# Patient Record
Sex: Male | Born: 1955 | Race: White | Hispanic: No | Marital: Married | Smoking: Former smoker
Health system: Southern US, Community
[De-identification: ages and names within clinical notes are randomized; demographics above are authoritative.]

## PROBLEM LIST (undated history)

## (undated) DIAGNOSIS — E785 Hyperlipidemia, unspecified: Secondary | ICD-10-CM

## (undated) DIAGNOSIS — Z973 Presence of spectacles and contact lenses: Secondary | ICD-10-CM

## (undated) DIAGNOSIS — H9319 Tinnitus, unspecified ear: Secondary | ICD-10-CM

## (undated) DIAGNOSIS — Z8719 Personal history of other diseases of the digestive system: Secondary | ICD-10-CM

## (undated) DIAGNOSIS — Z87440 Personal history of urinary (tract) infections: Secondary | ICD-10-CM

## (undated) DIAGNOSIS — T7840XA Allergy, unspecified, initial encounter: Secondary | ICD-10-CM

## (undated) DIAGNOSIS — K219 Gastro-esophageal reflux disease without esophagitis: Secondary | ICD-10-CM

## (undated) DIAGNOSIS — M1990 Unspecified osteoarthritis, unspecified site: Secondary | ICD-10-CM

## (undated) HISTORY — DX: Allergy, unspecified, initial encounter: T78.40XA

## (undated) HISTORY — PX: OTHER SURGICAL HISTORY: SHX169

## (undated) HISTORY — PX: WRIST SURGERY: SHX841

## (undated) HISTORY — DX: Hyperlipidemia, unspecified: E78.5

---

## 2010-12-24 ENCOUNTER — Encounter (INDEPENDENT_AMBULATORY_CARE_PROVIDER_SITE_OTHER): Payer: Self-pay | Admitting: *Deleted

## 2011-01-02 NOTE — Letter (Signed)
Summary: Pre Visit Letter Revised  [facility]  [address] [place], [place] [address]   Phone: [phone]  Fax: [phone]        12/24/2010 MRN: [id number] Jacob Griffith [address] [place], [place]  [address]             Procedure Date: February 03, 2011   dir col-Dr Tomasita Morrow to the Gastroenterology Division at [facility].    You are scheduled to see a nurse for your pre-procedure visit on January 20, 2011 at 4:30pm on the 3rd floor at [facility], [address]  We ask that you try to arrive at our office 15 minutes prior to your appointment time to allow for check-in.  Please take a minute to review the attached form.  If you answer "Yes" to one or more of the questions on the first page, we ask that you call the person listed at your earliest opportunity.  If you answer "No" to all of the questions, please complete the rest of the form and bring it to your appointment.    Your nurse visit will consist of discussing your medical and surgical history, your immediate family medical history, and your medications.   If you are unable to list all of your medications on the form, please bring the medication bottles to your appointment and we will list them.  We will need to be aware of both prescribed and over the counter drugs.  We will need to know exact dosage information as well.    Please be prepared to read and sign documents such as consent forms, a financial agreement, and acknowledgement forms.  If necessary, and with your consent, a friend or relative is welcome to sit-in on the nurse visit with you.  Please bring your insurance card so that we may make a copy of it.  If your insurance requires a referral to see a specialist, please bring your referral form from your primary care physician.  No co-pay is required for this nurse visit.     If you cannot keep your appointment, please call [phone] to cancel or reschedule prior to your  appointment date.  This allows [place] the opportunity to schedule an appointment for another patient in need of care.    Thank you for choosing [facility] for your medical needs.  We appreciate the opportunity to care for you.  Please visit [place] at our website  to learn more about our practice.  Sincerely, The Gastroenterology Division

## 2011-01-14 ENCOUNTER — Encounter (INDEPENDENT_AMBULATORY_CARE_PROVIDER_SITE_OTHER): Payer: Self-pay | Admitting: *Deleted

## 2011-01-20 ENCOUNTER — Encounter: Payer: Self-pay | Admitting: Gastroenterology

## 2011-01-28 NOTE — Letter (Signed)
Summary: [facility] Instructions  [facility]  [address] [place], [place] [address]   Phone: [phone]  Fax: [phone]       ANAND TEJADA    09-Oct-1970    MRN: [id number]        Procedure Day Dorna Bloom:  Duanne Limerick 02/03/11     Arrival Time: 9:30am     Procedure Time: 10:30am     Location of Procedure:                    Juliann Pares  [facility] (4th Floor)                        PREPARATION FOR COLONOSCOPY WITH MOVIPREP   Starting 5 days prior to your procedure  [facility] 03/14 do not eat nuts, seeds, popcorn, corn, beans, peas,  salads, or any raw vegetables.  Do not take any fiber supplements (e.g. Metamucil, Citrucel, and Benefiber).  THE DAY BEFORE YOUR PROCEDURE         DATE: SUNDAY 03/18  1.  Drink clear liquids the entire day-NO SOLID FOOD  2.  Do not drink anything colored red or purple.  Avoid juices with pulp.  No orange juice.  3.  Drink at least 64 oz. (8 glasses) of fluid/clear liquids during the day to prevent dehydration and help the prep work efficiently.  CLEAR LIQUIDS INCLUDE: Water Jello Ice Popsicles Tea (sugar ok, no milk/cream) Powdered fruit flavored drinks Coffee (sugar ok, no milk/cream) Gatorade Juice: apple, white grape, white cranberry  Lemonade Clear bullion, consomm, broth Carbonated beverages (any kind) Strained chicken noodle soup Hard Candy                             4.  In the morning, mix first dose of MoviPrep solution:    Empty 1 Pouch A and 1 Pouch B into the disposable container    Add lukewarm drinking water to the top line of the container. Mix to dissolve    Refrigerate (mixed solution should be used within 24 hrs)  5.  Begin drinking the prep at 5:00 p.m. The MoviPrep container is divided by 4 marks.   Every 15 minutes drink the solution down to the next mark (approximately 8 oz) until the full liter is complete.   6.  Follow completed prep with 16 oz of clear liquid of your choice (Nothing  red or purple).  Continue to drink clear liquids until bedtime.  7.  Before going to bed, mix second dose of MoviPrep solution:    Empty 1 Pouch A and 1 Pouch B into the disposable container    Add lukewarm drinking water to the top line of the container. Mix to dissolve    Refrigerate  THE DAY OF YOUR PROCEDURE      DATE: MONDAY 03/19  Beginning at  5:30a.m. (5 hours before procedure):         1. Every 15 minutes, drink the solution down to the next mark (approx 8 oz) until the full liter is complete.  2. Follow completed prep with 16 oz. of clear liquid of your choice.    3. You may drink clear liquids until 8:30am  (2 HOURS BEFORE PROCEDURE).   MEDICATION INSTRUCTIONS  Unless otherwise instructed, you should take regular prescription medications with a small sip of water   as early as possible the morning of your procedure.  OTHER INSTRUCTIONS  You will need a responsible adult at least 53 years of age to accompany you and drive you home.   This person must remain in the waiting room during your procedure.  Wear loose fitting clothing that is easily removed.  Leave jewelry and other valuables at home.  However, you may wish to bring a book to read or  an iPod/MP3 player to listen to music as you wait for your procedure to start.  Remove all body piercing jewelry and leave at home.  Total time from sign-in until discharge is approximately 2-3 hours.  You should go home directly after your procedure and rest.  You can resume normal activities the  day after your procedure.  The day of your procedure you should not:   Drive   Make legal decisions   Operate machinery   Drink alcohol   Return to work  You will receive specific instructions about eating, activities and medications before you leave.    The above instructions have been reviewed and explained to me by   Ezra Sites RN  January 20, 2011 4:57 PM     I fully understand and can  verbalize these instructions _____________________________ Date _________

## 2011-01-28 NOTE — Miscellaneous (Signed)
Summary: [facility]  Clinical Lists Changes  Medications: Added new medication of MOVIPREP 100 GM  SOLR (PEG-KCL-NACL-NASULF-NA ASC-C) As per prep instructions. - Signed Rx of MOVIPREP 100 GM  SOLR (PEG-KCL-NACL-NASULF-NA ASC-C) As per prep instructions.;  #1 x 0;  Signed;  Entered by: Ezra Sites RN;  Authorized by: Mardella Layman MD [facility];  Method used: [facility]  [phone] [place], [place], [place]  [address], Ph: [phone] or [phone], Fax: [phone] Allergies: Added new allergy or adverse reaction of AMOXICILLIN Observations: Added new observation of NKA: F (01/20/2011 16:25)    Prescriptions: MOVIPREP 100 GM  SOLR (PEG-KCL-NACL-NASULF-NA ASC-C) As per prep instructions.  #1 x 0   Entered by:   Ezra Sites RN   Authorized by:   Mardella Layman MD [facility]   Signed by:   Ezra Sites RN on 01/20/2011   Method used:   [facility]ronically to        [facility]  [address] [phone]* (retail)       [address] [place], [place]  [address]       Ph: [phone] or [phone]       Fax: [phone]   RxID:   [id number]

## 2011-02-03 ENCOUNTER — Other Ambulatory Visit: Payer: Self-pay | Admitting: Gastroenterology

## 2011-02-03 ENCOUNTER — Other Ambulatory Visit (AMBULATORY_SURGERY_CENTER): Payer: BC Managed Care – PPO | Admitting: Gastroenterology

## 2011-02-03 DIAGNOSIS — K5730 Diverticulosis of large intestine without perforation or abscess without bleeding: Secondary | ICD-10-CM

## 2011-02-03 DIAGNOSIS — Z1211 Encounter for screening for malignant neoplasm of colon: Secondary | ICD-10-CM

## 2011-02-03 DIAGNOSIS — D126 Benign neoplasm of colon, unspecified: Secondary | ICD-10-CM

## 2011-02-07 ENCOUNTER — Encounter: Payer: Self-pay | Admitting: Gastroenterology

## 2011-02-13 NOTE — Procedures (Addendum)
Summary: Colonoscopy  Patient: [name] Note: All result statuses are Final unless otherwise noted.  Tests: (1) Colonoscopy (COL)   COL Colonoscopy           DONE     [facility]     [address]     [place], [place]  [address]          COLONOSCOPY PROCEDURE REPORT          PATIENT:  Jacob Griffith, Jacob Griffith  MR#:  [id number]     BIRTHDATE:  Apr 07, 1956, 54 yrs. old  GENDER:  male     ENDOSCOPIST:  Vania Rea. Jarold Motto, MD, [facility]     REF. BY:     PROCEDURE DATE:  02/03/2011     PROCEDURE:  Colonoscopy with snare polypectomy     ASA CLASS:  Class I     INDICATIONS:  Routine Risk Screening     MEDICATIONS:   Fentanyl 100 mcg IV, Versed 7 mg IV          DESCRIPTION OF PROCEDURE:   After the risks benefits and     alternatives of the procedure were thoroughly explained, informed     consent was obtained.  Digital rectal exam was performed and     revealed no abnormalities.   The LB 180AL [id number] endoscope was     introduced through the anus and advanced to the cecum, which was     identified by both the appendix and ileocecal valve, without     limitations.  The quality of the prep was excellent, using     MoviPrep.  The instrument was then slowly withdrawn as the colon     was fully examined.     <<PROCEDUREIMAGES>>          FINDINGS:  Mild diverticulosis was found in the sigmoid to     descending colon segments.  A sessile polyp was found. 5 MM     SESSILE SIGMOID POLYP HOT SNARE EXCISED.  This was otherwise a     normal examination of the colon.   Retroflexed views in the rectum     revealed no abnormalities.    The scope was then withdrawn from     the patient and the procedure completed.          COMPLICATIONS:  None     ENDOSCOPIC IMPRESSION:     1) Mild diverticulosis in the sigmoid to descending colon     segments     2) Sessile polyp     3) Otherwise normal examination     R/O ADENOMA.     RECOMMENDATIONS:     1) high fiber diet     2) Repeat colonoscopy  in 5 years if polyp adenomatous; otherwise     10 years     REPEAT EXAM:  No          ______________________________     Vania Rea. Jarold Motto, MD, [facility]          CC:  Kirby Funk, MD          n.     Rosalie Doctor:   Vania Rea. Malakie Balis at 02/03/2011 10:57 AM          Maricela Bo, [phone]  Note: An exclamation mark (!) indicates a result that was not dispersed into the flowsheet. Document Creation Date: 02/03/2011 10:57 AM _______________________________________________________________________  (1) Order result status: Final Collection or observation date-time: 02/03/2011 10:49 Requested date-time:  Receipt date-time:  Reported date-time:  Referring Physician:   Ordering Physician: Sheryn Bison [phone]) Specimen Source:  Source: Launa Grill Order Number: [id number] Lab site:   Appended Document: Colonoscopy     Procedures Next Due Date:    Colonoscopy: 02/2021

## 2011-02-18 NOTE — Letter (Signed)
Summary: Patient Notice- Polyp Results  [facility]  [address] [place], [place] [address]   Phone: [phone]  Fax: [phone]        February 07, 2011 MRN: [id number]    Jacob Griffith [address] [place], [place]  [address]    Dear [name],  I am pleased to inform you that the colon polyp(s) removed during your recent colonoscopy was (were) found to be benign (no cancer detected) upon pathologic examination.  I recommend you have a repeat colonoscopy examination in 10_ years to look for recurrent polyps, as having colon polyps increases your risk for having recurrent polyps or even colon cancer in the future.  Should you develop new or worsening symptoms of abdominal pain, bowel habit changes or bleeding from the rectum or bowels, please schedule an evaluation with either your primary care physician or with me.  Additional information/recommendations:  _xx_ No further action with gastroenterology is needed at this time. Please      follow-up with your primary care physician for your other healthcare      needs.  __ Please call [phone] to schedule a return visit to review your      situation.  __ Please keep your follow-up visit as already scheduled.  __ Continue treatment plan as outlined the day of your exam.  Please call us if you are having persistent problems or have questions about your condition that have not been fully answered at this time.  Sincerely,  Mardella Layman MD [facility]  This letter has been electronically signed by your physician.

## 2012-02-10 ENCOUNTER — Ambulatory Visit (INDEPENDENT_AMBULATORY_CARE_PROVIDER_SITE_OTHER): Payer: BC Managed Care – PPO | Admitting: Family Medicine

## 2012-02-10 VITALS — BP 116/78 | HR 70 | Temp 98.4°F | Resp 16 | Ht 70.0 in | Wt 174.0 lb

## 2012-02-10 DIAGNOSIS — Z79899 Other long term (current) drug therapy: Secondary | ICD-10-CM

## 2012-02-10 DIAGNOSIS — M47812 Spondylosis without myelopathy or radiculopathy, cervical region: Secondary | ICD-10-CM

## 2012-02-10 DIAGNOSIS — M25519 Pain in unspecified shoulder: Secondary | ICD-10-CM

## 2012-02-10 MED ORDER — PREDNISONE 20 MG PO TABS: ORAL_TABLET | ORAL | Status: DC

## 2012-02-10 MED ORDER — CYCLOBENZAPRINE HCL 5 MG PO TABS: 5.0000 mg | ORAL_TABLET | Freq: Three times a day (TID) | ORAL | Status: AC | PRN

## 2012-02-10 NOTE — Progress Notes (Signed)
  Subjective:    Patient ID: Jacob Griffith, male    DOB: [birth year], 56 y.o.   MRN: [id number]  HPI Jacob Griffith is a 56 y.o. male L posterior shoulder pain x 2weeks, woke up with pain in neck.  Then as extended arm to throw something, felt pain into L shoulder.  Found sore area in back of L shoulder last night. Feels numbness in 2-3rd fingers just like last time - for past week.  No weakness in arm, and helps to elevate arm.  Woke up with crick in the neck 2 years ago. - seen here for same pain in shoulder - resolved with treatment last visit.  Had been doing ok since last ov - no residual pain.  No rash.  TX: ice, no pain meds.     Review of Systems Per hpi - no weakness, no sob/chest sx's.    Objective:   Physical Exam  Constitutional: He is oriented to person, place, and time. He appears well-developed and well-nourished.  Neck: Decreased range of motion present.  Cardiovascular: Normal rate, regular rhythm, normal heart sounds and intact distal pulses.   No murmur heard. Pulmonary/Chest: Effort normal.  Musculoskeletal:       Cervical back: He exhibits decreased range of motion. He exhibits no bony tenderness.       l shoulder to arm pain with flex, l rotation, and l lateral flexion.  Sx's relieved with shoulder abduction.  Neurological: He is alert and oriented to person, place, and time. He has normal strength. A sensory deficit is present.  Reflex Scores:      Tricep reflexes are 1+ on the right side and 1+ on the left side.      Bicep reflexes are 1+ on the right side and 1+ on the left side.      Brachioradialis reflexes are 2+ on the right side and 2+ on the left side.      2nd-3rd L fingers slight decr sensation.  Full arm/grip strength bilaterally.          Assessment & Plan:  Jacob Griffith is a 56 y.o. male L nec - to shoulder pain, and dysesthesias in C7 distribution.  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 Prednisone 20mg  - 3,3,2,2,1,1,1/2,1/2.  Flexeril 5mg  QHS -Q8hprn.  SED of both meds. Discussed XR if not improving with above in next 7-10 days,  or sooner if worsening including any weakness.

## 2013-08-25 ENCOUNTER — Ambulatory Visit (HOSPITAL_COMMUNITY)
Admission: RE | Admit: 2013-08-25 | Discharge: 2013-08-25 | Disposition: A | Discharge status: Home / Self Care | Payer: BC Managed Care – PPO | Source: Ambulatory Visit | Attending: Physical Medicine and Rehabilitation | Admitting: Physical Medicine and Rehabilitation

## 2013-08-25 ENCOUNTER — Other Ambulatory Visit (HOSPITAL_COMMUNITY): Payer: Self-pay | Admitting: Physical Medicine and Rehabilitation

## 2013-08-25 DIAGNOSIS — M545 Low back pain: Secondary | ICD-10-CM

## 2013-08-25 DIAGNOSIS — Z1389 Encounter for screening for other disorder: Secondary | ICD-10-CM | POA: Insufficient documentation

## 2013-08-26 ENCOUNTER — Ambulatory Visit: Payer: BC Managed Care – PPO

## 2013-08-26 ENCOUNTER — Telehealth: Payer: Self-pay | Admitting: Radiology

## 2013-08-26 ENCOUNTER — Ambulatory Visit (INDEPENDENT_AMBULATORY_CARE_PROVIDER_SITE_OTHER): Payer: BC Managed Care – PPO | Admitting: Internal Medicine

## 2013-08-26 VITALS — BP 140/80 | HR 71 | Temp 98.0°F | Resp 16 | Ht 68.5 in | Wt 184.0 lb

## 2013-08-26 DIAGNOSIS — M25531 Pain in right wrist: Secondary | ICD-10-CM

## 2013-08-26 DIAGNOSIS — M25539 Pain in unspecified wrist: Secondary | ICD-10-CM

## 2013-08-26 NOTE — Telephone Encounter (Signed)
Patient advised.

## 2013-08-26 NOTE — Telephone Encounter (Signed)
Left message for patient, his xray shows no fracture.

## 2013-08-26 NOTE — Progress Notes (Signed)
  Subjective:    Patient ID: Jacob Griffith, male    DOB: 30-May-1956, 57 y.o.   MRN: [id number]  HPI 57 year old male presents with right wrist pain s/p an injury while chopping wood 6 days ago.  Had not direct blow to his wrist but recalls pain while chopping. Since then he has had pain with ROM and to palpation.  No weakness or paresthesias.  Denies any prior wrist injuries or fractures.  He is left handed Patient is otherwise doing well with no other concerns today.     Review of Systems  Musculoskeletal: Positive for arthralgias (right wrist) and joint swelling.  Skin: Negative for wound.  Neurological: Negative for weakness and numbness.       Objective:   Physical Exam  Constitutional: He is oriented to person, place, and time. He appears well-developed.  HENT:  Head: Normocephalic and atraumatic.  Right Ear: External ear normal.  Left Ear: External ear normal.  Eyes: Conjunctivae are normal.  Neck: Normal range of motion.  Cardiovascular: Normal rate.   Pulmonary/Chest: Effort normal.  Musculoskeletal:       Right wrist: He exhibits decreased range of motion (secondary to pain), tenderness, bony tenderness (styloid process of the ulna) and swelling. He exhibits no deformity and no laceration.  Capillary refill normal <2 seconds. Sensation intact  Neurological: He is alert and oriented to person, place, and time.  Psychiatric: He has a normal mood and affect. His behavior is normal. Judgment and thought content normal.         [facility] reading (PRIMARY) by  Dr. Merla Riches as questionable nondisplaced fracture of ulnar styloid. .   Assessment & Plan:  Wrist pain, acute, right - Plan: DG Wrist Complete Right  Placed in wrist splint for comfort Continue to ice for 20 minutes to help with swelling Ibuprofen or tylenol as needed for pain Patient has an orthopedist at [facility] and will follow up with them if pain continues in 1-2 weeks RTC if acutely worsening.    I  have reviewed and agree with documentation. Robert P. Merla Riches, M.D.

## 2016-04-11 ENCOUNTER — Ambulatory Visit: Payer: Self-pay | Admitting: Orthopedic Surgery

## 2016-04-11 NOTE — Progress Notes (Signed)
Preoperative surgical orders have been place into the Epic hospital system for [name] on 04/11/2016, 3:54 PM  by Mickel Crow for surgery on 04-23-2016.  Preop Total Hip - Anterior Approach orders including IV Tylenol, and IV Decadron as long as there are no contraindications to the above medications. Arlee Muslim, PA-C

## 2016-04-17 ENCOUNTER — Encounter (HOSPITAL_COMMUNITY)
Admission: RE | Admit: 2016-04-17 | Discharge: 2016-04-17 | Disposition: A | Discharge status: Home / Self Care | Payer: 59 | Source: Ambulatory Visit | Attending: Orthopedic Surgery | Admitting: Orthopedic Surgery

## 2016-04-17 ENCOUNTER — Encounter (HOSPITAL_COMMUNITY): Payer: Self-pay

## 2016-04-17 DIAGNOSIS — Z01812 Encounter for preprocedural laboratory examination: Secondary | ICD-10-CM | POA: Insufficient documentation

## 2016-04-17 DIAGNOSIS — M1612 Unilateral primary osteoarthritis, left hip: Secondary | ICD-10-CM | POA: Insufficient documentation

## 2016-04-17 HISTORY — DX: Personal history of urinary (tract) infections: Z87.440

## 2016-04-17 HISTORY — DX: Presence of spectacles and contact lenses: Z97.3

## 2016-04-17 HISTORY — DX: Personal history of other diseases of the digestive system: Z87.19

## 2016-04-17 HISTORY — DX: Unspecified osteoarthritis, unspecified site: M19.90

## 2016-04-17 HISTORY — DX: Gastro-esophageal reflux disease without esophagitis: K21.9

## 2016-04-17 HISTORY — DX: Tinnitus, unspecified ear: H93.19

## 2016-04-17 LAB — COMPREHENSIVE METABOLIC PANEL
ALK PHOS: 73 U/L (ref 38–126)
ALT: 28 U/L (ref 17–63)
ANION GAP: 7 (ref 5–15)
AST: 28 U/L (ref 15–41)
Albumin: 4.8 g/dL (ref 3.5–5.0)
BUN: 15 mg/dL (ref 6–20)
CALCIUM: 9.6 mg/dL (ref 8.9–10.3)
CO2: 27 mmol/L (ref 22–32)
CREATININE: 1.03 mg/dL (ref 0.61–1.24)
Chloride: 102 mmol/L (ref 101–111)
Glucose, Bld: 89 mg/dL (ref 65–99)
Potassium: 4 mmol/L (ref 3.5–5.1)
SODIUM: 136 mmol/L (ref 135–145)
Total Bilirubin: 1 mg/dL (ref 0.3–1.2)
Total Protein: 8.1 g/dL (ref 6.5–8.1)

## 2016-04-17 LAB — URINALYSIS, ROUTINE W REFLEX MICROSCOPIC
Bilirubin Urine: NEGATIVE
Glucose, UA: NEGATIVE mg/dL
Hgb urine dipstick: NEGATIVE
KETONES UR: NEGATIVE mg/dL
LEUKOCYTES UA: NEGATIVE
NITRITE: NEGATIVE
PROTEIN: NEGATIVE mg/dL
Specific Gravity, Urine: 1.004 — ABNORMAL LOW (ref 1.005–1.030)
pH: 6.5 (ref 5.0–8.0)

## 2016-04-17 LAB — CBC
HCT: 47.8 % (ref 39.0–52.0)
Hemoglobin: 16.6 g/dL (ref 13.0–17.0)
MCH: 33.5 pg (ref 26.0–34.0)
MCHC: 34.7 g/dL (ref 30.0–36.0)
MCV: 96.4 fL (ref 78.0–100.0)
PLATELETS: 277 10*3/uL (ref 150–400)
RBC: 4.96 MIL/uL (ref 4.22–5.81)
RDW: 13.1 % (ref 11.5–15.5)
WBC: 7.3 10*3/uL (ref 4.0–10.5)

## 2016-04-17 LAB — SURGICAL PCR SCREEN
MRSA, PCR: NEGATIVE
STAPHYLOCOCCUS AUREUS: NEGATIVE

## 2016-04-17 LAB — PROTIME-INR
INR: 1.03 (ref 0.00–1.49)
PROTHROMBIN TIME: 13.3 s (ref 11.6–15.2)

## 2016-04-17 LAB — APTT: aPTT: 31 seconds (ref 24–37)

## 2016-04-17 NOTE — Patient Instructions (Addendum)
Jacob Griffith  04/17/2016   Your procedure is scheduled on: Wednesday April 23, 2016  Report to [facility] Main  Entrance take [place]  elevators to 3rd floor to  [place] at 8:45 AM.  Call this number if you have problems the morning of surgery [phone]   Remember: ONLY 1 PERSON MAY GO WITH YOU TO SHORT STAY TO GET  READY MORNING OF [place].  Do not eat food or drink liquids :After Midnight.     Take these medicines the morning of surgery: NONE                               You may not have any metal on your body including hair pins and              piercings  Do not wear jewelry,  lotions, powders or colognes, deodorant             Men may shave face and neck.   Do not bring valuables to the hospital. [name].  Contacts, dentures or bridgework may not be worn into surgery.  Leave suitcase in the car. After surgery it may be brought to your room.                Please read over the following fact sheets you were given:MRSA INFORMATION SHEET; INCENTIVE SPIROMETER; BLOOD TRANSFUSION INFORMATION SHEET  _____________________________________________________________________  [facility] - Preparing for Surgery Before surgery, you can play an important role.  Because skin is not sterile, your skin needs to be as free of germs as possible.  You can reduce the number of germs on your skin by washing with CHG (chlorahexidine gluconate) soap before surgery.  CHG is an antiseptic cleaner which kills germs and bonds with the skin to continue killing germs even after washing. Please DO NOT use if you have an allergy to CHG or antibacterial soaps.  If your skin becomes reddened/irritated stop using the CHG and inform your nurse when you arrive at Short Stay. Do not shave (including legs and underarms) for at least 48 hours prior to the first CHG shower.  You may shave your face/neck. Please follow these  instructions carefully:  1.  Shower with CHG Soap the night before surgery and the  morning of Surgery.  2.  If you choose to wash your hair, wash your hair first as usual with your  normal  shampoo.  3.  After you shampoo, rinse your hair and body thoroughly to remove the  shampoo.                           4.  Use CHG as you would any other liquid soap.  You can apply chg directly  to the skin and wash                       Gently with a scrungie or clean washcloth.  5.  Apply the CHG Soap to your body ONLY FROM THE NECK DOWN.   Do not use on face/ open  Wound or open sores. Avoid contact with eyes, ears mouth and genitals (private parts).                       Wash face,  Genitals (private parts) with your normal soap.             6.  Wash thoroughly, paying special attention to the area where your surgery  will be performed.  7.  Thoroughly rinse your body with warm water from the neck down.  8.  DO NOT shower/wash with your normal soap after using and rinsing off  the CHG Soap.                9.  Pat yourself dry with a clean towel.            10.  Wear clean pajamas.            11.  Place clean sheets on your bed the night of your first shower and do not  sleep with pets. Day of Surgery : Do not apply any lotions/deodorants the morning of surgery.  Please wear clean clothes to the hospital/surgery center.  FAILURE TO FOLLOW THESE INSTRUCTIONS MAY RESULT IN THE CANCELLATION OF YOUR SURGERY PATIENT SIGNATURE_________________________________  NURSE SIGNATURE__________________________________  ________________________________________________________________________               Jacob Griffith  An incentive spirometer is a tool that can help keep your lungs clear and active. This tool measures how well you are filling your lungs with each breath. Taking long deep breaths may help reverse or decrease the chance of developing breathing (pulmonary)  problems (especially infection) following:  A long period of time when you are unable to move or be active. BEFORE THE PROCEDURE   If the spirometer includes an indicator to show your best effort, your nurse or respiratory therapist will set it to a desired goal.  If possible, sit up straight or lean slightly forward. Try not to slouch.  Hold the incentive spirometer in an upright position. INSTRUCTIONS FOR USE   Sit on the edge of your bed if possible, or sit up as far as you can in bed or on a chair.  Hold the incentive spirometer in an upright position.  Breathe out normally.  Place the mouthpiece in your mouth and seal your lips tightly around it.  Breathe in slowly and as deeply as possible, raising the piston or the ball toward the top of the column.  Hold your breath for 3-5 seconds or for as long as possible. Allow the piston or ball to fall to the bottom of the column.  Remove the mouthpiece from your mouth and breathe out normally.  Rest for a few seconds and repeat Steps 1 through 7 at least 10 times every 1-2 hours when you are awake. Take your time and take a few normal breaths between deep breaths.  The spirometer may include an indicator to show your best effort. Use the indicator as a goal to work toward during each repetition.  After each set of 10 deep breaths, practice coughing to be sure your lungs are clear. If you have an incision (the cut made at the time of surgery), support your incision when coughing by placing a pillow or rolled up towels firmly against it. Once you are able to get out of bed, walk around indoors and cough well. You may stop using the incentive spirometer when instructed by your caregiver.  RISKS AND COMPLICATIONS  Take your time so you do not get dizzy or light-headed.  If you are in pain, you may need to take or ask for pain medication before doing incentive spirometry. It is harder to take a deep breath if you are having pain. AFTER  USE  Rest and breathe slowly and easily.  It can be helpful to keep track of a log of your progress. Your caregiver can provide you with a simple table to help with this. If you are using the spirometer at home, follow these instructions: [name] IF:   You are having difficultly using the spirometer.  You have trouble using the spirometer as often as instructed.  Your pain medication is not giving enough relief while using the spirometer.  You develop fever of 100.5 F (38.1 C) or higher. SEEK IMMEDIATE MEDICAL CARE IF:   You cough up bloody sputum that had not been present before.  You develop fever of 102 F (38.9 C) or greater.  You develop worsening pain at or near the incision site. MAKE SURE YOU:   Understand these instructions.  Will watch your condition.  Will get help right away if you are not doing well or get worse. Document Released: 03/16/2007 Document Revised: 01/26/2012 Document Reviewed: 05/17/2007 ExitCare Patient Information 2014 Exit[employer], [place].   ________________________________________________________________________  WHAT IS A BLOOD TRANSFUSION? Blood Transfusion Information  A transfusion is the replacement of blood or some of its parts. Blood is made up of multiple cells which provide different functions.  Red blood cells carry oxygen and are used for blood loss replacement.  White blood cells fight against infection.  Platelets control bleeding.  Plasma helps clot blood.  Other blood products are available for specialized needs, such as hemophilia or other clotting disorders. BEFORE THE TRANSFUSION  Who gives blood for transfusions?   Healthy volunteers who are fully evaluated to make sure their blood is safe. This is blood bank blood. Transfusion therapy is the safest it has ever been in the practice of medicine. Before blood is taken from a donor, a complete history is taken to make sure that person has no history of diseases  nor engages in risky social behavior (examples are intravenous drug use or sexual activity with multiple partners). The donor's travel history is screened to minimize risk of transmitting infections, such as malaria. The donated blood is tested for signs of infectious diseases, such as HIV and hepatitis. The blood is then tested to be sure it is compatible with you in order to minimize the chance of a transfusion reaction. If you or a relative donates blood, this is often done in anticipation of surgery and is not appropriate for emergency situations. It takes many days to process the donated blood. RISKS AND COMPLICATIONS Although transfusion therapy is very safe and saves many lives, the main dangers of transfusion include:   Getting an infectious disease.  Developing a transfusion reaction. This is an allergic reaction to something in the blood you were given. Every precaution is taken to prevent this. The decision to have a blood transfusion has been considered carefully by your caregiver before blood is given. Blood is not given unless the benefits outweigh the risks. AFTER THE TRANSFUSION  Right after receiving a blood transfusion, you will usually feel much better and more energetic. This is especially true if your red blood cells have gotten low (anemic). The transfusion raises the level of the red blood cells which carry oxygen, and this  usually causes an energy increase.  The nurse administering the transfusion will monitor you carefully for complications. HOME CARE INSTRUCTIONS  No special instructions are needed after a transfusion. You may find your energy is better. Speak with your caregiver about any limitations on activity for underlying diseases you may have. SEEK MEDICAL CARE IF:   Your condition is not improving after your transfusion.  You develop redness or irritation at the intravenous (IV) site. SEEK IMMEDIATE MEDICAL CARE IF:  Any of the following symptoms occur over the  next 12 hours:  Shaking chills.  You have a temperature by mouth above 102 F (38.9 C), not controlled by medicine.  Chest, back, or muscle pain.  People around you feel you are not acting correctly or are confused.  Shortness of breath or difficulty breathing.  Dizziness and fainting.  You get a rash or develop hives.  You have a decrease in urine output.  Your urine turns a dark color or changes to pink, red, or brown. Any of the following symptoms occur over the next 10 days:  You have a temperature by mouth above 102 F (38.9 C), not controlled by medicine.  Shortness of breath.  Weakness after normal activity.  The white part of the eye turns yellow (jaundice).  You have a decrease in the amount of urine or are urinating less often.  Your urine turns a dark color or changes to pink, red, or brown. Document Released: 10/31/2000 Document Revised: 01/26/2012 Document Reviewed: 06/19/2008 [facility] Patient Information 2014 [place], [place].  _______________________________________________________________________

## 2016-04-18 ENCOUNTER — Ambulatory Visit: Payer: Self-pay | Admitting: Orthopedic Surgery

## 2016-04-18 LAB — ABO/RH: ABO/RH(D): O POS

## 2016-04-18 NOTE — H&P (Signed)
Jacob Griffith DOB: 1956-01-29 Married / Language: English / Race: White Male Date of Admission:  04/23/2016 CC:  Left Hip Pain History of Present Illness The patient is a 60 year old male who comes in for a preoperative History and Physical. The patient is scheduled for a left total hip arthroplasty (anterior) to be performed by Dr. Dione Plover. [name], MD at [facility] on 04-23-2016. The patient is a 60 year old male who presents today for follow up of their hip. The patient is being followed for their bilateral (left hip worse than right) hip pain and osteoarthritis. They are year(s) out from IA injection about 1 year ago. Symptoms reported today include: pain, aching, stiffness and pain with weightbearing. The patient feels that they are doing poorly and report their pain level to be severe. Current treatment includes: activity modification. The following medication has been used for pain control: none. The patient presents today following IA cortisone injections. The patient has reported improvement of their symptoms with: activity modification and Cortisone injections. The patient indicates that they have questions or concerns today regarding pain and their progress at this point (Pt. would like to discuss sx for left hip). Jacob Griffith comes back in today for evaluation and discussion of his left hip. He has known bilateral hip arthritis but eh left hip is the most symptomatic and problematic of the two. He has undergone previous hip injections (three in the left hip and one in the right hip) which have not provided good relief. He states the left hip injection may have helped for a day or so and the right hip injection did help for a while. He has been putting up with the hip pain for quite some time. He was told by Dr. Wynelle Link aobut two years ago that he would likely need a hip replacement in the next few years and now he has reached that point at this time.  He has been limping for a long  time which he feels has been afefecting his back. He was seen by Asencion Partridge, Dr. Rolena Infante' PA, recently for his back but it was felt that most of his pain was coming from his hip and was recommended to come back in to see Dr. Wynelle Link. He is at a point now, as long as Dr. Wynelle Link agrees, he is ready to proceed with surgical intervention for the left hip at this time. [name] is at a stage now where the hip is bothering him at all times. Both hips are very symptomatic, but the left is worse than the right. He has documented advanced arthritis of the hip. He has had multiple injections without benefit. Hips are having very negative factor in his life. Not only they are hurting him, but he has also lost lot of motion and lot of function. He is ready to proceed with surgery. They have been treated conservatively in the past for the above stated problem and despite conservative measures, they continue to have progressive pain and severe functional limitations and dysfunction. They have failed non-operative management including home exercise, medications. It is felt that they would benefit from undergoing total joint replacement. Risks and benefits of the procedure have been discussed with the patient and they elect to proceed with surgery. There are no active contraindications to surgery such as ongoing infection or rapidly progressive neurological disease.  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 Joint Pain  Family History Drug / Alcohol Addiction  mother and grandfather fathers side Hypertension  father Osteoarthritis  grandmother mothers side Rheumatoid Arthritis  father and  grandmother mothers side Cancer  Father. father  Social History Alcohol use  current drinker; drinks beer; 8-14 per week Living situation  live with spouse Illicit drug use  no Tobacco use  Never smoker. never smoker Exercise  Exercises weekly; does other Pain Contract  no Number of flights of stairs before winded  2-3 Marital status  married Children  2 Drug/Alcohol Rehab (Previously)  no Drug/Alcohol Rehab (Currently)  no Current work status  working full time  Medication History  Glucosamine Chondr 500 Complex (Oral) Active. Garlic (Oral) Active. Vitamin D (1000UNIT Tablet, Oral) Active. Atorvastatin Calcium (10MG  Tablet, Oral) Active. Xanax (0.5MG  Tablet, Oral) Active. Aspirin (81MG  Tablet Chewable, Oral) Active.  Past Surgical History Arthroscopy of Knee  left Carpal Tunnel Repair  left Vasectomy    Review of Systems General Not Present- Chills, Fatigue, Fever, Memory Loss, Night Sweats, Weight Gain and Weight Loss. Skin Not Present- Eczema, Hives, Itching, Lesions and Rash. HEENT Not Present- Dentures, Double Vision, Headache, Hearing Loss, Tinnitus and Visual Loss. Respiratory Not Present- Allergies, Chronic Cough, Coughing up blood, Shortness of breath at rest and Shortness of breath with exertion. Cardiovascular Not Present- Chest Pain, Difficulty Breathing Lying Down, Murmur, Palpitations, Racing/skipping heartbeats and Swelling. Gastrointestinal Not Present- Abdominal Pain, Bloody Stool, Constipation, Diarrhea, Difficulty Swallowing, Heartburn, Jaundice, Loss of appetitie, Nausea and Vomiting. Male Genitourinary Not Present- Blood in Urine, Discharge, Flank Pain, Incontinence, Painful Urination, Urgency, Urinary frequency, Urinary Retention, Urinating at Night and Weak urinary stream. Musculoskeletal Present- Joint Pain. Not Present- Back Pain, Joint Swelling, Morning Stiffness, Muscle Pain, Muscle Weakness and Spasms. Neurological Not Present-  Blackout spells, Difficulty with balance, Dizziness, Paralysis, Tremor and Weakness. Psychiatric Not Present- Insomnia.  Vitals Weight: 185 lb Height: 67in Weight was reported by patient. Height was reported by patient. Body Surface Area: 1.96 m Body Mass Index: 28.97 kg/m  Pulse: 88 (Regular)  BP: 118/80 (Sitting, Right Arm, Standard)  Physical Exam General Mental Status -Alert, cooperative and good historian. General Appearance-pleasant, Not in acute distress. Orientation-Oriented X3. Build & Nutrition-Well nourished and Well developed.  Head and Neck Head-normocephalic, atraumatic . Neck Global Assessment - supple, no bruit auscultated on the right, no bruit auscultated on the left.  Eye Vision-Wears corrective lenses. Pupil - Bilateral-Regular and Round. Motion - Bilateral-EOMI.  Chest and Lung Exam Auscultation Breath sounds - clear at anterior chest wall and clear at posterior chest wall. Adventitious sounds - No Adventitious sounds.  Cardiovascular Auscultation Rhythm - Regular rate and rhythm. Heart Sounds - S1 WNL and S2 WNL. Murmurs & Other Heart Sounds - Auscultation of the heart reveals - No Murmurs.  Abdomen Palpation/Percussion Tenderness - Abdomen is non-tender to palpation. Rigidity (guarding) - Abdomen is soft. Auscultation Auscultation of the abdomen reveals - Bowel sounds normal.  Male Genitourinary Note: Not done, not pertinent to present illness   Musculoskeletal Note: He is alert and oriented, no apparent distress. Left hip can only be flexed to 90 with no internal rotation, about 10 to 20 of external rotation, 20 of abduction. The right hip flex to about 100, minimal internal rotation, 20 external rotation, 20 abduction. Knee exam is normal bilaterally. Pulse, sensation  and motor intact. He has a significantly antalgic gait pattern.  RADIOGRAPHS AP pelvis and lateral of both hips show severe end stage arthritis of  both hips, bone on bone in both with subchondral cystic formation and osteophyte formation. The left side is slightly worse than right.   Assessment & Plan Primary osteoarthritis of right hip (M16.11) Primary osteoarthritis of left hip (M16.12)  Note:Surgical Plans: Left Total Hip Replacement - Anterior Approach  Disposition: Home  PCP: Dr. Lavone Orn - Patient has been seen preoperatively and felt to be stable for surgery.  IV TXA  Anesthesia Issues: None  Signed electronically by Ok Edwards, III PA-C

## 2016-04-19 NOTE — Anesthesia Preprocedure Evaluation (Addendum)
Anesthesia Evaluation  Patient identified by MRN, date of birth, ID band Patient awake    Reviewed: Allergy & Precautions, NPO status , Patient's Chart, lab work & pertinent test results  Airway Mallampati: II   Neck ROM: Full    Dental  (+) Teeth Intact, Dental Advisory Given   Pulmonary neg pulmonary ROS, former smoker (quit 1977),    breath sounds clear to auscultation       Cardiovascular negative cardio ROS   Rhythm:Regular     Neuro/Psych Anxiety negative neurological ROS  negative psych ROS   GI/Hepatic Neg liver ROS, GERD  ,  Endo/Other  negative endocrine ROS  Renal/GU negative Renal ROS  negative genitourinary   Musculoskeletal negative musculoskeletal ROS (+) Arthritis ,   Abdominal   Peds negative pediatric ROS (+)  Hematology negative hematology ROS (+) 16/48, plts 277, INR 1   Anesthesia Other Findings   Reproductive/Obstetrics negative OB ROS                           Anesthesia Physical Anesthesia Plan  ASA: II  Anesthesia Plan: General   Post-op Pain Management:    Induction:   Airway Management Planned: Oral ETT  Additional Equipment:   Intra-op Plan:   Post-operative Plan: Extubation in OR  Informed Consent: I have reviewed the patients History and Physical, chart, labs and discussed the procedure including the risks, benefits and alternatives for the proposed anesthesia with the patient or authorized representative who has indicated his/her understanding and acceptance.   Dental advisory given  Plan Discussed with:   Anesthesia Plan Comments:        Anesthesia Quick Evaluation

## 2016-04-23 ENCOUNTER — Encounter (HOSPITAL_COMMUNITY): Payer: Self-pay | Admitting: *Deleted

## 2016-04-23 ENCOUNTER — Inpatient Hospital Stay (HOSPITAL_COMMUNITY): Payer: 59 | Admitting: Anesthesiology

## 2016-04-23 ENCOUNTER — Encounter (HOSPITAL_COMMUNITY)
Admission: RE | Disposition: A | Discharge status: Home Health | Payer: Self-pay | Source: Ambulatory Visit | Attending: Orthopedic Surgery

## 2016-04-23 ENCOUNTER — Inpatient Hospital Stay (HOSPITAL_COMMUNITY)
Admission: RE | Admit: 2016-04-23 | Discharge: 2016-04-24 | DRG: 470 | Disposition: A | Discharge status: Home Health | Payer: 59 | Source: Ambulatory Visit | Attending: Orthopedic Surgery | Admitting: Orthopedic Surgery

## 2016-04-23 ENCOUNTER — Inpatient Hospital Stay (HOSPITAL_COMMUNITY): Payer: 59

## 2016-04-23 DIAGNOSIS — M25752 Osteophyte, left hip: Secondary | ICD-10-CM | POA: Diagnosis present

## 2016-04-23 DIAGNOSIS — E785 Hyperlipidemia, unspecified: Secondary | ICD-10-CM | POA: Diagnosis present

## 2016-04-23 DIAGNOSIS — Z7982 Long term (current) use of aspirin: Secondary | ICD-10-CM | POA: Diagnosis not present

## 2016-04-23 DIAGNOSIS — Z87440 Personal history of urinary (tract) infections: Secondary | ICD-10-CM

## 2016-04-23 DIAGNOSIS — Z79899 Other long term (current) drug therapy: Secondary | ICD-10-CM

## 2016-04-23 DIAGNOSIS — M25552 Pain in left hip: Secondary | ICD-10-CM | POA: Diagnosis present

## 2016-04-23 DIAGNOSIS — K219 Gastro-esophageal reflux disease without esophagitis: Secondary | ICD-10-CM | POA: Diagnosis present

## 2016-04-23 DIAGNOSIS — M5136 Other intervertebral disc degeneration, lumbar region: Secondary | ICD-10-CM | POA: Diagnosis present

## 2016-04-23 DIAGNOSIS — Z880 Allergy status to penicillin: Secondary | ICD-10-CM

## 2016-04-23 DIAGNOSIS — M160 Bilateral primary osteoarthritis of hip: Principal | ICD-10-CM | POA: Diagnosis present

## 2016-04-23 DIAGNOSIS — M5442 Lumbago with sciatica, left side: Secondary | ICD-10-CM | POA: Diagnosis present

## 2016-04-23 DIAGNOSIS — Z8261 Family history of arthritis: Secondary | ICD-10-CM

## 2016-04-23 DIAGNOSIS — Z8249 Family history of ischemic heart disease and other diseases of the circulatory system: Secondary | ICD-10-CM

## 2016-04-23 DIAGNOSIS — M169 Osteoarthritis of hip, unspecified: Secondary | ICD-10-CM | POA: Diagnosis present

## 2016-04-23 DIAGNOSIS — Z96649 Presence of unspecified artificial hip joint: Secondary | ICD-10-CM

## 2016-04-23 HISTORY — PX: TOTAL HIP ARTHROPLASTY: SHX124

## 2016-04-23 SURGERY — ARTHROPLASTY, HIP, TOTAL, ANTERIOR APPROACH
Anesthesia: General | Site: Hip | Laterality: Left

## 2016-04-23 MED ORDER — LACTATED RINGERS IV SOLN
INTRAVENOUS | Status: DC
  Administered 2016-04-23: 13:00:00 via INTRAVENOUS

## 2016-04-23 MED ORDER — FENTANYL CITRATE (PF) 250 MCG/5ML IJ SOLN
INTRAMUSCULAR | Status: AC
  Filled 2016-04-23: qty 5

## 2016-04-23 MED ORDER — ATORVASTATIN CALCIUM 20 MG PO TABS
20.0000 mg | ORAL_TABLET | Freq: Every day | ORAL | Status: DC
  Filled 2016-04-23: qty 1

## 2016-04-23 MED ORDER — ACETAMINOPHEN 10 MG/ML IV SOLN
INTRAVENOUS | Status: AC
  Filled 2016-04-23: qty 100

## 2016-04-23 MED ORDER — MORPHINE SULFATE (PF) 2 MG/ML IV SOLN
1.0000 mg | INTRAVENOUS | Status: DC | PRN
  Administered 2016-04-23: 1 mg via INTRAVENOUS
  Filled 2016-04-23: qty 1

## 2016-04-23 MED ORDER — METHOCARBAMOL 1000 MG/10ML IJ SOLN
500.0000 mg | Freq: Four times a day (QID) | INTRAVENOUS | Status: DC | PRN
  Filled 2016-04-23: qty 5

## 2016-04-23 MED ORDER — PROMETHAZINE HCL 25 MG/ML IJ SOLN: 6.2500 mg | INTRAMUSCULAR | Status: DC | PRN

## 2016-04-23 MED ORDER — ACETAMINOPHEN 325 MG PO TABS: 650.0000 mg | ORAL_TABLET | Freq: Four times a day (QID) | ORAL | Status: DC | PRN

## 2016-04-23 MED ORDER — ONDANSETRON HCL 4 MG PO TABS: 4.0000 mg | ORAL_TABLET | Freq: Four times a day (QID) | ORAL | Status: DC | PRN

## 2016-04-23 MED ORDER — MIDAZOLAM HCL 2 MG/2ML IJ SOLN
INTRAMUSCULAR | Status: AC
  Filled 2016-04-23: qty 2

## 2016-04-23 MED ORDER — BISACODYL 10 MG RE SUPP: 10.0000 mg | Freq: Every day | RECTAL | Status: DC | PRN

## 2016-04-23 MED ORDER — ACETAMINOPHEN 650 MG RE SUPP: 650.0000 mg | Freq: Four times a day (QID) | RECTAL | Status: DC | PRN

## 2016-04-23 MED ORDER — METOCLOPRAMIDE HCL 5 MG PO TABS: 5.0000 mg | ORAL_TABLET | Freq: Three times a day (TID) | ORAL | Status: DC | PRN

## 2016-04-23 MED ORDER — LACTATED RINGERS IV SOLN
INTRAVENOUS | Status: DC | PRN
  Administered 2016-04-23: 09:00:00 via INTRAVENOUS

## 2016-04-23 MED ORDER — METHOCARBAMOL 500 MG PO TABS
500.0000 mg | ORAL_TABLET | Freq: Four times a day (QID) | ORAL | Status: DC | PRN
  Administered 2016-04-23: 500 mg via ORAL
  Filled 2016-04-23: qty 1

## 2016-04-23 MED ORDER — ACETAMINOPHEN 10 MG/ML IV SOLN
1000.0000 mg | Freq: Once | INTRAVENOUS | Status: AC
  Administered 2016-04-23: 1000 mg via INTRAVENOUS

## 2016-04-23 MED ORDER — SODIUM CHLORIDE 0.9 % IV SOLN
INTRAVENOUS | Status: DC
  Administered 2016-04-23: 22:00:00 via INTRAVENOUS

## 2016-04-23 MED ORDER — ROCURONIUM BROMIDE 100 MG/10ML IV SOLN
INTRAVENOUS | Status: AC
  Filled 2016-04-23: qty 1

## 2016-04-23 MED ORDER — DOCUSATE SODIUM 100 MG PO CAPS
100.0000 mg | ORAL_CAPSULE | Freq: Two times a day (BID) | ORAL | Status: DC
  Administered 2016-04-23 – 2016-04-24 (×2): 100 mg via ORAL
  Filled 2016-04-23 (×2): qty 1

## 2016-04-23 MED ORDER — FENTANYL CITRATE (PF) 100 MCG/2ML IJ SOLN
INTRAMUSCULAR | Status: AC
  Filled 2016-04-23: qty 2

## 2016-04-23 MED ORDER — TRAMADOL HCL 50 MG PO TABS: 50.0000 mg | ORAL_TABLET | Freq: Four times a day (QID) | ORAL | Status: DC | PRN

## 2016-04-23 MED ORDER — POLYETHYLENE GLYCOL 3350 17 G PO PACK: 17.0000 g | PACK | Freq: Every day | ORAL | Status: DC | PRN

## 2016-04-23 MED ORDER — TRANEXAMIC ACID 1000 MG/10ML IV SOLN
1000.0000 mg | INTRAVENOUS | Status: AC
  Administered 2016-04-23: 1000 mg via INTRAVENOUS
  Filled 2016-04-23: qty 10

## 2016-04-23 MED ORDER — RIVAROXABAN 10 MG PO TABS
10.0000 mg | ORAL_TABLET | Freq: Every day | ORAL | Status: DC
  Administered 2016-04-24: 10 mg via ORAL
  Filled 2016-04-23: qty 1

## 2016-04-23 MED ORDER — PHENOL 1.4 % MT LIQD
1.0000 | OROMUCOSAL | Status: DC | PRN
Start: 2016-04-23 — End: 2016-04-24
  Filled 2016-04-23: qty 177

## 2016-04-23 MED ORDER — LIDOCAINE HCL (CARDIAC) 20 MG/ML IV SOLN
INTRAVENOUS | Status: DC | PRN
  Administered 2016-04-23: 80 mg via INTRATRACHEAL

## 2016-04-23 MED ORDER — MEPERIDINE HCL 50 MG/ML IJ SOLN: 6.2500 mg | INTRAMUSCULAR | Status: DC | PRN

## 2016-04-23 MED ORDER — ALPRAZOLAM 0.5 MG PO TABS: 0.5000 mg | ORAL_TABLET | Freq: Every evening | ORAL | Status: DC | PRN

## 2016-04-23 MED ORDER — TRANEXAMIC ACID 1000 MG/10ML IV SOLN
1000.0000 mg | Freq: Once | INTRAVENOUS | Status: AC
  Administered 2016-04-23: 1000 mg via INTRAVENOUS
  Filled 2016-04-23: qty 10

## 2016-04-23 MED ORDER — ACETAMINOPHEN 500 MG PO TABS
1000.0000 mg | ORAL_TABLET | Freq: Four times a day (QID) | ORAL | Status: AC
  Administered 2016-04-23 – 2016-04-24 (×4): 1000 mg via ORAL
  Filled 2016-04-23 (×4): qty 2

## 2016-04-23 MED ORDER — FLEET ENEMA 7-19 GM/118ML RE ENEM: 1.0000 | ENEMA | Freq: Once | RECTAL | Status: DC | PRN

## 2016-04-23 MED ORDER — OXYCODONE HCL 5 MG PO TABS
5.0000 mg | ORAL_TABLET | ORAL | Status: DC | PRN
  Administered 2016-04-23 – 2016-04-24 (×2): 5 mg via ORAL
  Filled 2016-04-23 (×3): qty 1

## 2016-04-23 MED ORDER — MIDAZOLAM HCL 5 MG/5ML IJ SOLN
INTRAMUSCULAR | Status: DC | PRN
  Administered 2016-04-23: 2 mg via INTRAVENOUS

## 2016-04-23 MED ORDER — GABAPENTIN 300 MG PO CAPS: 300.0000 mg | ORAL_CAPSULE | Freq: Every evening | ORAL | Status: DC | PRN

## 2016-04-23 MED ORDER — ONDANSETRON HCL 4 MG/2ML IJ SOLN: 4.0000 mg | Freq: Four times a day (QID) | INTRAMUSCULAR | Status: DC | PRN

## 2016-04-23 MED ORDER — HYDROMORPHONE HCL 1 MG/ML IJ SOLN
0.2500 mg | INTRAMUSCULAR | Status: DC | PRN
  Administered 2016-04-23 (×4): 0.5 mg via INTRAVENOUS

## 2016-04-23 MED ORDER — BUPIVACAINE HCL (PF) 0.25 % IJ SOLN
INTRAMUSCULAR | Status: DC | PRN
  Administered 2016-04-23: 30 mL

## 2016-04-23 MED ORDER — FENTANYL CITRATE (PF) 100 MCG/2ML IJ SOLN
25.0000 ug | INTRAMUSCULAR | Status: DC | PRN
  Administered 2016-04-23 (×2): 50 ug via INTRAVENOUS

## 2016-04-23 MED ORDER — DEXAMETHASONE SODIUM PHOSPHATE 10 MG/ML IJ SOLN
10.0000 mg | Freq: Once | INTRAMUSCULAR | Status: AC
  Administered 2016-04-23: 10 mg via INTRAVENOUS

## 2016-04-23 MED ORDER — LIDOCAINE HCL (CARDIAC) 20 MG/ML IV SOLN
INTRAVENOUS | Status: AC
  Filled 2016-04-23: qty 5

## 2016-04-23 MED ORDER — FENTANYL CITRATE (PF) 100 MCG/2ML IJ SOLN
INTRAMUSCULAR | Status: DC | PRN
  Administered 2016-04-23 (×3): 50 ug via INTRAVENOUS
  Administered 2016-04-23: 100 ug via INTRAVENOUS
  Administered 2016-04-23 (×2): 50 ug via INTRAVENOUS

## 2016-04-23 MED ORDER — VANCOMYCIN HCL IN DEXTROSE 1-5 GM/200ML-% IV SOLN
1000.0000 mg | Freq: Two times a day (BID) | INTRAVENOUS | Status: AC
  Administered 2016-04-23: 1000 mg via INTRAVENOUS
  Filled 2016-04-23: qty 200

## 2016-04-23 MED ORDER — DEXAMETHASONE SODIUM PHOSPHATE 10 MG/ML IJ SOLN
INTRAMUSCULAR | Status: AC
  Filled 2016-04-23: qty 1

## 2016-04-23 MED ORDER — BUPIVACAINE HCL (PF) 0.25 % IJ SOLN
INTRAMUSCULAR | Status: AC
  Filled 2016-04-23: qty 30

## 2016-04-23 MED ORDER — VANCOMYCIN HCL IN DEXTROSE 1-5 GM/200ML-% IV SOLN
1000.0000 mg | INTRAVENOUS | Status: AC
  Administered 2016-04-23: 1000 mg via INTRAVENOUS
  Filled 2016-04-23: qty 200

## 2016-04-23 MED ORDER — SUGAMMADEX SODIUM 200 MG/2ML IV SOLN
INTRAVENOUS | Status: AC
  Filled 2016-04-23: qty 2

## 2016-04-23 MED ORDER — MENTHOL 3 MG MT LOZG: 1.0000 | LOZENGE | OROMUCOSAL | Status: DC | PRN

## 2016-04-23 MED ORDER — HYDROMORPHONE HCL 1 MG/ML IJ SOLN
INTRAMUSCULAR | Status: AC
  Filled 2016-04-23: qty 1

## 2016-04-23 MED ORDER — DIPHENHYDRAMINE HCL 12.5 MG/5ML PO ELIX: 12.5000 mg | ORAL_SOLUTION | ORAL | Status: DC | PRN

## 2016-04-23 MED ORDER — PROPOFOL 10 MG/ML IV BOLUS
INTRAVENOUS | Status: AC
  Filled 2016-04-23: qty 60

## 2016-04-23 MED ORDER — DEXAMETHASONE SODIUM PHOSPHATE 10 MG/ML IJ SOLN
10.0000 mg | Freq: Once | INTRAMUSCULAR | Status: AC
  Administered 2016-04-24: 10 mg via INTRAVENOUS
  Filled 2016-04-23: qty 1

## 2016-04-23 MED ORDER — ONDANSETRON HCL 4 MG/2ML IJ SOLN
INTRAMUSCULAR | Status: AC
  Filled 2016-04-23: qty 2

## 2016-04-23 MED ORDER — ONDANSETRON HCL 4 MG/2ML IJ SOLN
INTRAMUSCULAR | Status: DC | PRN
  Administered 2016-04-23: 4 mg via INTRAVENOUS

## 2016-04-23 MED ORDER — SODIUM CHLORIDE 0.9 % IV SOLN
INTRAVENOUS | Status: DC
  Administered 2016-04-23: 09:00:00 via INTRAVENOUS

## 2016-04-23 MED ORDER — METOCLOPRAMIDE HCL 5 MG/ML IJ SOLN: 5.0000 mg | Freq: Three times a day (TID) | INTRAMUSCULAR | Status: DC | PRN

## 2016-04-23 MED ORDER — PROPOFOL 10 MG/ML IV BOLUS
INTRAVENOUS | Status: DC | PRN
  Administered 2016-04-23: 150 mg via INTRAVENOUS

## 2016-04-23 MED ORDER — ROCURONIUM BROMIDE 100 MG/10ML IV SOLN
INTRAVENOUS | Status: DC | PRN
  Administered 2016-04-23: 40 mg via INTRAVENOUS

## 2016-04-23 MED ORDER — SUGAMMADEX SODIUM 200 MG/2ML IV SOLN
INTRAVENOUS | Status: DC | PRN
  Administered 2016-04-23: 200 mg via INTRAVENOUS

## 2016-04-23 SURGICAL SUPPLY — 35 items
BAG DECANTER FOR FLEXI CONT (MISCELLANEOUS) ×3 IMPLANT
BAG SPEC THK2 15X12 ZIP CLS (MISCELLANEOUS)
BAG ZIPLOCK 12X15 (MISCELLANEOUS) IMPLANT
BLADE SAG 18X100X1.27 (BLADE) ×3 IMPLANT
CAPT HIP TOTAL 2 ×2 IMPLANT
CLOSURE WOUND 1/2 X4 (GAUZE/BANDAGES/DRESSINGS) ×1
CLOTH BEACON ORANGE TIMEOUT ST (SAFETY) ×3 IMPLANT
COVER PERINEAL POST (MISCELLANEOUS) ×3 IMPLANT
DECANTER SPIKE VIAL GLASS SM (MISCELLANEOUS) ×3 IMPLANT
DRAPE STERI IOBAN 125X83 (DRAPES) ×3 IMPLANT
DRAPE U-SHAPE 47X51 STRL (DRAPES) ×6 IMPLANT
DRSG ADAPTIC 3X8 NADH LF (GAUZE/BANDAGES/DRESSINGS) ×3 IMPLANT
DRSG MEPILEX BORDER 4X4 (GAUZE/BANDAGES/DRESSINGS) ×3 IMPLANT
DRSG MEPILEX BORDER 4X8 (GAUZE/BANDAGES/DRESSINGS) ×3 IMPLANT
DURAPREP 26ML APPLICATOR (WOUND CARE) ×3 IMPLANT
ELECT REM PT RETURN 9FT ADLT (ELECTROSURGICAL) ×3
ELECTRODE REM PT RTRN 9FT ADLT (ELECTROSURGICAL) ×1 IMPLANT
EVACUATOR 1/8 PVC DRAIN (DRAIN) ×3 IMPLANT
GLOVE BIO SURGEON STRL SZ7.5 (GLOVE) ×3 IMPLANT
GLOVE BIO SURGEON STRL SZ8 (GLOVE) ×6 IMPLANT
GLOVE BIOGEL PI IND STRL 8 (GLOVE) ×2 IMPLANT
GLOVE BIOGEL PI INDICATOR 8 (GLOVE) ×4
GOWN STRL REUS W/TWL LRG LVL3 (GOWN DISPOSABLE) ×3 IMPLANT
GOWN STRL REUS W/TWL XL LVL3 (GOWN DISPOSABLE) ×3 IMPLANT
PACK ANTERIOR HIP CUSTOM (KITS) ×3 IMPLANT
STRIP CLOSURE SKIN 1/2X4 (GAUZE/BANDAGES/DRESSINGS) ×2 IMPLANT
SUT ETHIBOND NAB CT1 #1 30IN (SUTURE) ×3 IMPLANT
SUT MNCRL AB 4-0 PS2 18 (SUTURE) ×3 IMPLANT
SUT VIC AB 2-0 CT1 27 (SUTURE) ×6
SUT VIC AB 2-0 CT1 TAPERPNT 27 (SUTURE) ×2 IMPLANT
SUT VLOC 180 0 24IN GS25 (SUTURE) ×3 IMPLANT
SYR 50ML LL SCALE MARK (SYRINGE) IMPLANT
TRAY FOLEY W/METER SILVER 14FR (SET/KITS/TRAYS/PACK) IMPLANT
TRAY FOLEY W/METER SILVER 16FR (SET/KITS/TRAYS/PACK) ×1 IMPLANT
YANKAUER SUCT BULB TIP 10FT TU (MISCELLANEOUS) ×3 IMPLANT

## 2016-04-23 NOTE — Op Note (Signed)
OPERATIVE REPORT  PREOPERATIVE DIAGNOSIS: Osteoarthritis of the Left hip.   POSTOPERATIVE DIAGNOSIS: Osteoarthritis of the Left  hip.   PROCEDURE: Left total hip arthroplasty, anterior approach.   SURGEON: Gaynelle Arabian, MD   ASSISTANT: Molli Barrows, PA-C  ANESTHESIA:  General  ESTIMATED BLOOD LOSS:-400 ml    DRAINS: Hemovac x1.   COMPLICATIONS: None   CONDITION: PACU - hemodynamically stable.   BRIEF CLINICAL NOTE: Jacob Griffith is a 60 y.o. male who has advanced end-  stage arthritis of his Left  hip with progressively worsening pain and  dysfunction.The patient has failed nonoperative management and presents for  total hip arthroplasty.   PROCEDURE IN DETAIL: After successful administration of spinal  anesthetic, the traction boots for the [facility] bed were placed on both  feet and the patient was placed onto the [facility] bed, boots placed into the leg  holders. The Left hip was then isolated from the perineum with plastic  drapes and prepped and draped in the usual sterile fashion. ASIS and  greater trochanter were marked and a oblique incision was made, starting  at about 1 cm lateral and 2 cm distal to the ASIS and coursing towards  the anterior cortex of the femur. The skin was cut with a 10 blade  through subcutaneous tissue to the level of the fascia overlying the  tensor fascia lata muscle. The fascia was then incised in line with the  incision at the junction of the anterior third and posterior 2/3rd. The  muscle was teased off the fascia and then the interval between the TFL  and the rectus was developed. The Hohmann retractor was then placed at  the top of the femoral neck over the capsule. The vessels overlying the  capsule were cauterized and the fat on top of the capsule was removed.  A Hohmann retractor was then placed anterior underneath the rectus  femoris to give exposure to the entire anterior capsule. A T-shaped  capsulotomy was performed. The  edges were tagged and the femoral head  was identified.       Osteophytes are removed off the superior acetabulum.  The femoral neck was then cut in situ with an oscillating saw. Traction  was then applied to the left lower extremity utilizing the [facility]  traction. The femoral head was then removed. Retractors were placed  around the acetabulum and then circumferential removal of the labrum was  performed. Osteophytes were also removed. Reaming starts at 47 mm to  medialize and  Increased in 2 mm increments to 53 mm. We reamed in  approximately 40 degrees of abduction, 20 degrees anteversion. A 54 mm  pinnacle acetabular shell was then impacted in anatomic position under  fluoroscopic guidance with excellent purchase. We did not need to place  any additional dome screws. A 36 mm neutral + 4 marathon liner was then  placed into the acetabular shell.       The femoral lift was then placed along the lateral aspect of the femur  just distal to the vastus ridge. The leg was  externally rotated and capsule  was stripped off the inferior aspect of the femoral neck down to the  level of the lesser trochanter, this was done with electrocautery. The femur was lifted after this was performed. The  leg was then placed and extended in adducted position to essentially delivering the femur. We also removed the capsule superiorly and the  piriformis from the piriformis  fossa to gain excellent exposure of the  proximal femur. Rongeur was used to remove some cancellous bone to get  into the lateral portion of the proximal femur for placement of the  initial starter reamer. The starter broaches was placed  the starter broach  and was shown to go down the center of the canal. Broaching  with the  Corail system was then performed starting at size 8, coursing  Up to size 12. A size 12 had excellent torsional and rotational  and axial stability. The trial standard offset neck was then placed  with a 36 + 1.5 trial  head. The hip was then reduced. We confirmed that  the stem was in the canal both on AP and lateral x-rays. It also has excellent sizing. The hip was reduced with outstanding stability through full extension, full external rotation,  and then flexion in adduction internal rotation. AP pelvis was taken  and the leg lengths were measured and found to be exactly equal. Hip  was then dislocated again and the femoral head and neck removed. The  femoral broach was removed. Size 12 Corail stem with a standard offset  neck was then impacted into the femur following native anteversion. Has  excellent purchase in the canal. Excellent torsional and rotational and  axial stability. It is confirmed to be in the canal on AP and lateral  fluoroscopic views. The 36 + 1.5 ceamic head was placed and the hip  reduced with outstanding stability. Again AP pelvis was taken and it  confirmed that the leg lengths were equal. The wound was then copiously  irrigated with saline solution and the capsule reattached and repaired  with Ethibond suture. 30 ml of .25% Bupivicaine injected into the capsule and into the edge of the tensor fascia lata as well as subcutaneous tissue. The fascia overlying the tensor fascia lata was  then closed with a running #1 V-Loc. Subcu was closed with interrupted  2-0 Vicryl and subcuticular running 4-0 Monocryl. Incision was cleaned  and dried. Steri-Strips and a bulky sterile dressing applied. Hemovac  drain was hooked to suction and then he was awakened and transported to  recovery in stable condition.        Please note that a surgical assistant was a medical necessity for this procedure to perform it in a safe and expeditious manner. Assistant was necessary to provide appropriate retraction of vital neurovascular structures and to prevent femoral fracture and allow for anatomic placement of the prosthesis.  Gaynelle Arabian, M.D.

## 2016-04-23 NOTE — Transfer of Care (Signed)
Immediate Anesthesia Transfer of Care Note  Patient: Jacob Griffith  Procedure(s) Performed: Procedure(s): LEFT TOTAL HIP ARTHROPLASTY ANTERIOR APPROACH (Left)  Patient Location: PACU  Anesthesia Type:General  Level of Consciousness: awake, alert  and oriented  Airway & Oxygen Therapy: Patient Spontanous Breathing and Patient connected to face mask oxygen  Post-op Assessment: Report given to RN and Post -op Vital signs reviewed and stable  Post vital signs: Reviewed and stable  Last Vitals:  Filed Vitals:   04/23/16 0842  BP: 124/80  Pulse: 77  Temp: 36.7 C  Resp: 18    Last Pain: There were no vitals filed for this visit.    Patients Stated Pain Goal: 4 ([id number])  Complications: No apparent anesthesia complications

## 2016-04-23 NOTE — Anesthesia Procedure Notes (Signed)
Procedure Name: Intubation Date/Time: 04/23/2016 10:19 AM Performed by: Dione Booze Pre-anesthesia Checklist: Emergency Drugs available, Suction available, Patient identified and Timeout performed Patient Re-evaluated:Patient Re-evaluated prior to inductionOxygen Delivery Method: Circle system utilized Preoxygenation: Pre-oxygenation with 100% oxygen Intubation Type: IV induction Ventilation: Mask ventilation without difficulty Laryngoscope Size: Mac and 4 Grade View: Grade I Tube type: Oral Tube size: 7.5 mm Number of attempts: 1 Airway Equipment and Method: Stylet Placement Confirmation: breath sounds checked- equal and bilateral,  ETT inserted through vocal cords under direct vision and positive ETCO2 Secured at: 21 cm Tube secured with: Tape Dental Injury: Teeth and Oropharynx as per pre-operative assessment

## 2016-04-23 NOTE — H&P (View-Only) (Signed)
Jacob Griffith DOB: 1956/02/08 Married / Language: English / Race: White Male Date of Admission:  04/23/2016 CC:  Left Hip Pain History of Present Illness The patient is a 60 year old male who comes in for a preoperative History and Physical. The patient is scheduled for a left total hip arthroplasty (anterior) to be performed by Dr. Dione Plover. [name], MD at [facility] on 04-23-2016. The patient is a 60 year old male who presents today for follow up of their hip. The patient is being followed for their bilateral (left hip worse than right) hip pain and osteoarthritis. They are year(s) out from IA injection about 1 year ago. Symptoms reported today include: pain, aching, stiffness and pain with weightbearing. The patient feels that they are doing poorly and report their pain level to be severe. Current treatment includes: activity modification. The following medication has been used for pain control: none. The patient presents today following IA cortisone injections. The patient has reported improvement of their symptoms with: activity modification and Cortisone injections. The patient indicates that they have questions or concerns today regarding pain and their progress at this point (Pt. would like to discuss sx for left hip). [name] comes back in today for evaluation and discussion of his left hip. He has known bilateral hip arthritis but eh left hip is the most symptomatic and problematic of the two. He has undergone previous hip injections (three in the left hip and one in the right hip) which have not provided good relief. He states the left hip injection may have helped for a day or so and the right hip injection did help for a while. He has been putting up with the hip pain for quite some time. He was told by Dr. Wynelle Link aobut two years ago that he would likely need a hip replacement in the next few years and now he has reached that point at this time.  He has been limping for a long  time which he feels has been afefecting his back. He was seen by Asencion Partridge, Dr. Rolena Infante' PA, recently for his back but it was felt that most of his pain was coming from his hip and was recommended to come back in to see Dr. Wynelle Link. He is at a point now, as long as Dr. Wynelle Link agrees, he is ready to proceed with surgical intervention for the left hip at this time. Demiko is at a stage now where the hip is bothering him at all times. Both hips are very symptomatic, but the left is worse than the right. He has documented advanced arthritis of the hip. He has had multiple injections without benefit. Hips are having very negative factor in his life. Not only they are hurting him, but he has also lost lot of motion and lot of function. He is ready to proceed with surgery. They have been treated conservatively in the past for the above stated problem and despite conservative measures, they continue to have progressive pain and severe functional limitations and dysfunction. They have failed non-operative management including home exercise, medications. It is felt that they would benefit from undergoing total joint replacement. Risks and benefits of the procedure have been discussed with the patient and they elect to proceed with surgery. There are no active contraindications to surgery such as ongoing infection or rapidly progressive neurological disease.  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 Joint Pain  Family History Drug / Alcohol Addiction  mother and grandfather fathers side Hypertension  father Osteoarthritis  grandmother mothers side Rheumatoid Arthritis  father and  grandmother mothers side Cancer  Father. father  Social History Alcohol use  current drinker; drinks beer; 8-14 per week Living situation  live with spouse Illicit drug use  no Tobacco use  Never smoker. never smoker Exercise  Exercises weekly; does other Pain Contract  no Number of flights of stairs before winded  2-3 Marital status  married Children  2 Drug/Alcohol Rehab (Previously)  no Drug/Alcohol Rehab (Currently)  no Current work status  working full time  Medication History  Glucosamine Chondr 500 Complex (Oral) Active. Garlic (Oral) Active. Vitamin D (1000UNIT Tablet, Oral) Active. Atorvastatin Calcium (10MG  Tablet, Oral) Active. Xanax (0.5MG  Tablet, Oral) Active. Aspirin (81MG  Tablet Chewable, Oral) Active.  Past Surgical History Arthroscopy of Knee  left Carpal Tunnel Repair  left Vasectomy    Review of Systems General Not Present- Chills, Fatigue, Fever, Memory Loss, Night Sweats, Weight Gain and Weight Loss. Skin Not Present- Eczema, Hives, Itching, Lesions and Rash. HEENT Not Present- Dentures, Double Vision, Headache, Hearing Loss, Tinnitus and Visual Loss. Respiratory Not Present- Allergies, Chronic Cough, Coughing up blood, Shortness of breath at rest and Shortness of breath with exertion. Cardiovascular Not Present- Chest Pain, Difficulty Breathing Lying Down, Murmur, Palpitations, Racing/skipping heartbeats and Swelling. Gastrointestinal Not Present- Abdominal Pain, Bloody Stool, Constipation, Diarrhea, Difficulty Swallowing, Heartburn, Jaundice, Loss of appetitie, Nausea and Vomiting. Male Genitourinary Not Present- Blood in Urine, Discharge, Flank Pain, Incontinence, Painful Urination, Urgency, Urinary frequency, Urinary Retention, Urinating at Night and Weak urinary stream. Musculoskeletal Present- Joint Pain. Not Present- Back Pain, Joint Swelling, Morning Stiffness, Muscle Pain, Muscle Weakness and Spasms. Neurological Not Present-  Blackout spells, Difficulty with balance, Dizziness, Paralysis, Tremor and Weakness. Psychiatric Not Present- Insomnia.  Vitals Weight: 185 lb Height: 67in Weight was reported by patient. Height was reported by patient. Body Surface Area: 1.96 m Body Mass Index: 28.97 kg/m  Pulse: 88 (Regular)  BP: 118/80 (Sitting, Right Arm, Standard)  Physical Exam General Mental Status -Alert, cooperative and good historian. General Appearance-pleasant, Not in acute distress. Orientation-Oriented X3. Build & Nutrition-Well nourished and Well developed.  Head and Neck Head-normocephalic, atraumatic . Neck Global Assessment - supple, no bruit auscultated on the right, no bruit auscultated on the left.  Eye Vision-Wears corrective lenses. Pupil - Bilateral-Regular and Round. Motion - Bilateral-EOMI.  Chest and Lung Exam Auscultation Breath sounds - clear at anterior chest wall and clear at posterior chest wall. Adventitious sounds - No Adventitious sounds.  Cardiovascular Auscultation Rhythm - Regular rate and rhythm. Heart Sounds - S1 WNL and S2 WNL. Murmurs & Other Heart Sounds - Auscultation of the heart reveals - No Murmurs.  Abdomen Palpation/Percussion Tenderness - Abdomen is non-tender to palpation. Rigidity (guarding) - Abdomen is soft. Auscultation Auscultation of the abdomen reveals - Bowel sounds normal.  Male Genitourinary Note: Not done, not pertinent to present illness   Musculoskeletal Note: He is alert and oriented, no apparent distress. Left hip can only be flexed to 90 with no internal rotation, about 10 to 20 of external rotation, 20 of abduction. The right hip flex to about 100, minimal internal rotation, 20 external rotation, 20 abduction. Knee exam is normal bilaterally. Pulse, sensation  and motor intact. He has a significantly antalgic gait pattern.  RADIOGRAPHS AP pelvis and lateral of both hips show severe end stage arthritis of  both hips, bone on bone in both with subchondral cystic formation and osteophyte formation. The left side is slightly worse than right.   Assessment & Plan Primary osteoarthritis of right hip (M16.11) Primary osteoarthritis of left hip (M16.12)  Note:Surgical Plans: Left Total Hip Replacement - Anterior Approach  Disposition: Home  PCP: Dr. Lavone Orn - Patient has been seen preoperatively and felt to be stable for surgery.  IV TXA  Anesthesia Issues: None  Signed electronically by Ok Edwards, III PA-C

## 2016-04-23 NOTE — Evaluation (Addendum)
Physical Therapy Evaluation Patient Details Name: Jacob Griffith MRN: [id number] DOB: 04/13/1956 Today's Date: 04/23/2016   History of Present Illness  Pt s/p L THR  Clinical Impression  Pt s/p L THR presents with decreased L LE strength/ROM and post op pain limiting functional mobility.  Pt should progress well to dc home with family assist and HHPT follow up.    Follow Up Recommendations Home health PT    Equipment Recommendations  None recommended by PT    Recommendations for Other Services OT consult     Precautions / Restrictions Precautions Precautions: Fall Restrictions Weight Bearing Restrictions: No      Mobility  Bed Mobility Overal bed mobility: Needs Assistance Bed Mobility: Sit to Supine;Supine to Sit     Supine to sit: Min assist Sit to supine: Min assist   General bed mobility comments: cues for sequence and use of R LE to self assist  Transfers Overall transfer level: Needs assistance Equipment used: Rolling walker (2 wheeled) Transfers: Sit to/from Stand Sit to Stand: Min assist         General transfer comment: cues for LE management and use of UEs to self assist  Ambulation/Gait Ambulation/Gait assistance: Min assist;+2 safety/equipment Ambulation Distance (Feet): 4 Feet Assistive device: Rolling walker (2 wheeled) Gait Pattern/deviations: Step-to pattern;Decreased step length - right;Decreased step length - left;Shuffle;Trunk flexed Gait velocity: decr Gait velocity interpretation: Below normal speed for age/gender General Gait Details: Stepped back to and side stepped up to top of bed  Stairs            Wheelchair Mobility    Modified Rankin (Stroke Patients Only)       Balance                                             Pertinent Vitals/Pain Pain Assessment: 0-10 Pain Score: 5  Pain Location:  (L hip) Pain Descriptors / Indicators: Aching;Sore Pain Intervention(s): Limited activity within patient's  tolerance;Monitored during session;Premedicated before session;Ice applied    Home Living Family/patient expects to be discharged to:: Private residence Living Arrangements: Spouse/significant other Available Help at Discharge: Family Type of Home: House Home Access: Stairs to enter Entrance Stairs-Rails: Psychiatric nurse of Steps: 3 Home Layout: One level Home Equipment: Environmental consultant - 2 wheels;Crutches;Cane - single point      Prior Function Level of Independence: Independent               Hand Dominance   Dominant Hand: Right    Extremity/Trunk Assessment   Upper Extremity Assessment: Overall WFL for tasks assessed           Lower Extremity Assessment: LLE deficits/detail   LLE Deficits / Details: Strength at hip 2+/5 with AAROM at hip to 85 flex and 15 abd  Cervical / Trunk Assessment: Normal  Communication   Communication: No difficulties  Cognition Arousal/Alertness: Awake/alert Behavior During Therapy: WFL for tasks assessed/performed Overall Cognitive Status: Within Functional Limits for tasks assessed                      General Comments      Exercises Total Joint Exercises Ankle Circles/Pumps: AROM;Both;15 reps;Supine Quad Sets: AROM;Both;10 reps;Supine Heel Slides: AAROM;Left;20 reps;Supine Hip ABduction/ADduction: AAROM;Left;15 reps;Supine      Assessment/Plan    PT Assessment Patient needs continued PT services  PT Diagnosis Difficulty walking  PT Problem List Decreased strength;Decreased range of motion;Decreased activity tolerance;Decreased mobility;Decreased knowledge of use of DME;Pain  PT Treatment Interventions DME instruction;Gait training;Stair training;Functional mobility training;Therapeutic activities;Therapeutic exercise;Patient/family education   PT Goals (Current goals can be found in the Care Plan section) Acute Rehab PT Goals Patient Stated Goal: Regain IND PT Goal Formulation: With patient Time  For Goal Achievement: 04/26/16 Potential to Achieve Goals: Good    Frequency 7X/week   Barriers to discharge        Co-evaluation               End of Session Equipment Utilized During Treatment: Gait belt Activity Tolerance: Patient limited by fatigue Patient left: in bed;with call bell/phone within reach;with bed alarm set Nurse Communication: Mobility status         Time: [id number] PT Time Calculation (min) (ACUTE ONLY): 29 min   Charges:   PT Evaluation $PT Eval Low Complexity: 1 Procedure PT Treatments $Therapeutic Exercise: 8-22 mins   PT G Codes:        Veldon Wager 2016/05/06, 5:50 PM

## 2016-04-23 NOTE — Anesthesia Postprocedure Evaluation (Signed)
Anesthesia Post Note  Patient: Harroll Smilowitz  Procedure(s) Performed: Procedure(s) (LRB): LEFT TOTAL HIP ARTHROPLASTY ANTERIOR APPROACH (Left)  Patient location during evaluation: PACU Anesthesia Type: General Level of consciousness: awake and alert Pain management: pain level controlled Vital Signs Assessment: post-procedure vital signs reviewed and stable Respiratory status: spontaneous breathing, nonlabored ventilation, respiratory function stable and patient connected to nasal cannula oxygen Cardiovascular status: blood pressure returned to baseline and stable Postop Assessment: no signs of nausea or vomiting Anesthetic complications: no    Last Vitals:  Filed Vitals:   04/23/16 1230 04/23/16 1245  BP: 139/88 129/91  Pulse: 73 80  Temp:    Resp: 10 14    Last Pain:  Filed Vitals:   04/23/16 1250  PainSc: 5     LLE Motor Response: Purposeful movement (04/23/16 1245) LLE Sensation: Full sensation (04/23/16 1245)          Alexis Frock

## 2016-04-23 NOTE — Interval H&P Note (Signed)
History and Physical Interval Note:  04/23/2016 9:40 AM  [name]  has presented today for surgery, with the diagnosis of left hip osteoathritis  The various methods of treatment have been discussed with the patient and family. After consideration of risks, benefits and other options for treatment, the patient has consented to  Procedure(s): LEFT TOTAL HIP ARTHROPLASTY ANTERIOR APPROACH (Left) as a surgical intervention .  The patient's history has been reviewed, patient examined, no change in status, stable for surgery.  I have reviewed the patient's chart and labs.  Questions were answered to the patient's satisfaction.     Jacob Griffith

## 2016-04-24 LAB — CBC
HEMATOCRIT: 38.3 % — AB (ref 39.0–52.0)
HEMOGLOBIN: 13.4 g/dL (ref 13.0–17.0)
MCH: 32.8 pg (ref 26.0–34.0)
MCHC: 35 g/dL (ref 30.0–36.0)
MCV: 93.9 fL (ref 78.0–100.0)
Platelets: 261 10*3/uL (ref 150–400)
RBC: 4.08 MIL/uL — AB (ref 4.22–5.81)
RDW: 12.8 % (ref 11.5–15.5)
WBC: 13.4 10*3/uL — AB (ref 4.0–10.5)

## 2016-04-24 LAB — BASIC METABOLIC PANEL
ANION GAP: 6 (ref 5–15)
BUN: 13 mg/dL (ref 6–20)
CHLORIDE: 106 mmol/L (ref 101–111)
CO2: 24 mmol/L (ref 22–32)
CREATININE: 0.91 mg/dL (ref 0.61–1.24)
Calcium: 8.5 mg/dL — ABNORMAL LOW (ref 8.9–10.3)
GFR calc non Af Amer: >60 mL/min (ref >60)
Glucose, Bld: 123 mg/dL — ABNORMAL HIGH (ref 65–99)
POTASSIUM: 4.1 mmol/L (ref 3.5–5.1)
SODIUM: 136 mmol/L (ref 135–145)

## 2016-04-24 LAB — TYPE AND SCREEN
ABO/RH(D): O POS
Antibody Screen: NEGATIVE

## 2016-04-24 MED ORDER — RIVAROXABAN 10 MG PO TABS: 10.0000 mg | ORAL_TABLET | Freq: Every day | ORAL | Status: DC

## 2016-04-24 MED ORDER — TRAMADOL HCL 50 MG PO TABS: 50.0000 mg | ORAL_TABLET | Freq: Four times a day (QID) | ORAL | Status: DC | PRN

## 2016-04-24 MED ORDER — METHOCARBAMOL 500 MG PO TABS: 500.0000 mg | ORAL_TABLET | Freq: Four times a day (QID) | ORAL | Status: DC | PRN

## 2016-04-24 NOTE — Discharge Instructions (Signed)
° °Dr. Frank [name] °Total Joint Specialist °[facility] °[address] °[place], [place] [address] °[phone] ° °ANTERIOR APPROACH TOTAL HIP REPLACEMENT POSTOPERATIVE DIRECTIONS ° ° °Hip Rehabilitation, Guidelines Following Surgery  °The results of a hip operation are greatly improved after range of motion and muscle strengthening exercises. Follow all safety measures which are given to protect your hip. If any of these exercises cause increased pain or swelling in your joint, decrease the amount until you are comfortable again. Then slowly increase the exercises. Call your caregiver if you have problems or questions.  ° °HOME CARE INSTRUCTIONS  °Remove items at home which could result in a fall. This includes throw rugs or furniture in walking pathways.  °· ICE to the affected hip every three hours for 30 minutes at a time and then as needed for pain and swelling.  Continue to use ice on the hip for pain and swelling from surgery. You may notice swelling that will progress down to the foot and ankle.  This is normal after surgery.  Elevate the leg when you are not up walking on it.   °· Continue to use the breathing machine which will help keep your temperature down.  It is common for your temperature to cycle up and down following surgery, especially at night when you are not up moving around and exerting yourself.  The breathing machine keeps your lungs expanded and your temperature down. ° ° °DIET °You may resume your previous home diet once your are discharged from the hospital. ° °DRESSING / WOUND CARE / SHOWERING °You may start showering once you are discharged home but do not submerge the incision under water. Just pat the incision dry and apply a dry gauze dressing on daily. °Change the surgical dressing daily and reapply a dry dressing each time. ° °ACTIVITY °Walk with your walker as instructed. °Use walker as long as suggested by your caregivers. °Avoid periods of inactivity  such as sitting longer than an hour when not asleep. This helps prevent blood clots.  °You may resume a sexual relationship in one month or when given the OK by your doctor.  °You may return to work once you are cleared by your doctor.  °Do not drive a car for 6 weeks or until released by you surgeon.  °Do not drive while taking narcotics. ° °WEIGHT BEARING °Weight bearing as tolerated with assist device (walker, cane, etc) as directed, use it as long as suggested by your surgeon or therapist, typically at least 4-6 weeks. ° °POSTOPERATIVE CONSTIPATION PROTOCOL °Constipation - defined medically as fewer than three stools per week and severe constipation as less than one stool per week. ° °One of the most common issues patients have following surgery is constipation.  Even if you have a regular bowel pattern at home, your normal regimen is likely to be disrupted due to multiple reasons following surgery.  Combination of anesthesia, postoperative narcotics, change in appetite and fluid intake all can affect your bowels.  In order to avoid complications following surgery, here are some recommendations in order to help you during your recovery period. ° °Colace (docusate) - Pick up an over-the-counter form of Colace or another stool softener and take twice a day as long as you are requiring postoperative pain medications.  Take with a full glass of water daily.  If you experience loose stools or diarrhea, hold the colace until you stool forms back up.  If your symptoms do not get better within 1   week or if they get worse, check with your doctor. ° °Dulcolax (bisacodyl) - Pick up over-the-counter and take as directed by the product packaging as needed to assist with the movement of your bowels.  Take with a full glass of water.  Use this product as needed if not relieved by Colace only.  ° °MiraLax (polyethylene glycol) - Pick up over-the-counter to have on hand.  MiraLax is a solution that will increase the amount of  water in your bowels to assist with bowel movements.  Take as directed and can mix with a glass of water, juice, soda, coffee, or tea.  Take if you go more than two days without a movement. °Do not use MiraLax more than once per day. Call your doctor if you are still constipated or irregular after using this medication for 7 days in a row. ° °If you continue to have problems with postoperative constipation, please contact the office for further assistance and recommendations.  If you experience "the worst abdominal pain ever" or develop nausea or vomiting, please contact the office immediatly for further recommendations for treatment. ° °ITCHING ° If you experience itching with your medications, try taking only a single pain pill, or even half a pain pill at a time.  You can also use Benadryl over the counter for itching or also to help with sleep.  ° °TED HOSE STOCKINGS °Wear the elastic stockings on both legs for three weeks following surgery during the day but you may remove then at night for sleeping. ° °MEDICATIONS °See your medication summary on the “After Visit Summary” that the nursing staff will review with you prior to discharge.  You may have some home medications which will be placed on hold until you complete the course of blood thinner medication.  It is important for you to complete the blood thinner medication as prescribed by your surgeon.  Continue your approved medications as instructed at time of discharge. ° °PRECAUTIONS °If you experience chest pain or shortness of breath - call 911 immediately for transfer to the hospital emergency department.  °If you develop a fever greater that 101 F, purulent drainage from wound, increased redness or drainage from wound, foul odor from the wound/dressing, or calf pain - CONTACT YOUR SURGEON.   °                                                °FOLLOW-UP APPOINTMENTS °Make sure you keep all of your appointments after your operation with your surgeon and  caregivers. You should call the office at the above phone number and make an appointment for approximately two weeks after the date of your surgery or on the date instructed by your surgeon outlined in the "After Visit Summary". ° °RANGE OF MOTION AND STRENGTHENING EXERCISES  °These exercises are designed to help you keep full movement of your hip joint. Follow your caregiver's or physical therapist's instructions. Perform all exercises about fifteen times, three times per day or as directed. Exercise both hips, even if you have had only one joint replacement. These exercises can be done on a training (exercise) mat, on the floor, on a table or on a bed. Use whatever works the best and is most comfortable for you. Use music or television while you are exercising so that the exercises are a pleasant break in your day. This   will make your life better with the exercises acting as a break in routine you can look forward to.  °Lying on your back, slowly slide your foot toward your buttocks, raising your knee up off the floor. Then slowly slide your foot back down until your leg is straight again.  °Lying on your back spread your legs as far apart as you can without causing discomfort.  °Lying on your side, raise your upper leg and foot straight up from the floor as far as is comfortable. Slowly lower the leg and repeat.  °Lying on your back, tighten up the muscle in the front of your thigh (quadriceps muscles). You can do this by keeping your leg straight and trying to raise your heel off the floor. This helps strengthen the largest muscle supporting your knee.  °Lying on your back, tighten up the muscles of your buttocks both with the legs straight and with the knee bent at a comfortable angle while keeping your heel on the floor.  ° °IF YOU ARE TRANSFERRED TO A SKILLED REHAB FACILITY °If the patient is transferred to a skilled rehab facility following release from the hospital, a list of the current medications will be  sent to the facility for the patient to continue.  When discharged from the skilled rehab facility, please have the facility set up the patient's Home Health Physical Therapy prior to being released. Also, the skilled facility will be responsible for providing the patient with their medications at time of release from the facility to include their pain medication, the muscle relaxants, and their blood thinner medication. If the patient is still at the rehab facility at time of the two week follow up appointment, the skilled rehab facility will also need to assist the patient in arranging follow up appointment in our office and any transportation needs. ° °MAKE SURE YOU:  °Understand these instructions.  °Get help right away if you are not doing well or get worse.  ° ° °Pick up stool softner and laxative for home use following surgery while on pain medications. °Do not submerge incision under water. °Please use good hand washing techniques while changing dressing each day. °May shower starting three days after surgery. °Please use a clean towel to pat the incision dry following showers. °Continue to use ice for pain and swelling after surgery. °Do not use any lotions or creams on the incision until instructed by your surgeon. ° °Information on my medicine - XARELTO® (Rivaroxaban) ° °This medication education was reviewed with me or my healthcare representative as part of my discharge preparation.  The pharmacist that spoke with me during my hospital stay was:  Bobette Leyh A, RPH ° °Why was Xarelto® prescribed for you? °Xarelto® was prescribed for you to reduce the risk of blood clots forming after orthopedic surgery. The medical term for these abnormal blood clots is venous thromboembolism (VTE). ° °What do you need to know about xarelto® ? °Take your Xarelto® ONCE DAILY at the same time every day. °You may take it either with or without food. ° °If you have difficulty swallowing the tablet whole, you may crush it  and mix in applesauce just prior to taking your dose. ° °Take Xarelto® exactly as prescribed by your doctor and DO NOT stop taking Xarelto® without talking to the doctor who prescribed the medication.  Stopping without other VTE prevention medication to take the place of Xarelto® may increase your risk of developing a clot. ° °After discharge, you should have regular   check-up appointments with your healthcare provider that is prescribing your Xarelto®.   ° °What do you do if you miss a dose? °If you miss a dose, take it as soon as you remember on the same day then continue your regularly scheduled once daily regimen the next day. Do not take two doses of Xarelto® on the same day.  ° °Important Safety Information °A possible side effect of Xarelto® is bleeding. You should call your healthcare provider right away if you experience any of the following: °? Bleeding from an injury or your nose that does not stop. °? Unusual colored urine (red or dark brown) or unusual colored stools (red or black). °? Unusual bruising for unknown reasons. °? A serious fall or if you hit your head (even if there is no bleeding). ° °Some medicines may interact with Xarelto® and might increase your risk of bleeding while on Xarelto®. To help avoid this, consult your healthcare provider or pharmacist prior to using any new prescription or non-prescription medications, including herbals, vitamins, non-steroidal anti-inflammatory drugs (NSAIDs) and supplements. ° °This website has more information on Xarelto®: www.xarelto.com. ° ° ° °

## 2016-04-24 NOTE — Progress Notes (Signed)
   Subjective: 1 Day Post-Op Procedure(s) (LRB): LEFT TOTAL HIP ARTHROPLASTY ANTERIOR APPROACH (Left) Patient reports pain as mild.   Patient seen in rounds with Dr. Wynelle Link. Patient is well, and has had no acute complaints or problems. No issues overnight. Plan is to go Home after hospital stay.  Objective: Vital signs in last 24 hours: Temp:  [97.5 F (36.4 C)-98.7 F (37.1 C)] 98.7 F (37.1 C) (06/08 0613) Pulse Rate:  [62-82] 71 (06/08 0613) Resp:  [10-18] 16 (06/08 0613) BP: (116-146)/(74-99) 119/74 mmHg (06/08 0613) SpO2:  [95 %-100 %] 97 % (06/08 0613) Weight:  [85.73 kg (189 lb)] 85.73 kg (189 lb) (06/07 1309)  Intake/Output from previous day:  Intake/Output Summary (Last 24 hours) at 04/24/16 0806 Last data filed at 04/24/16 0641  Gross per 24 hour  Intake 2821.67 ml  Output   3870 ml  Net -1048.33 ml     Labs:  Recent Labs  04/24/16 0415  HGB 13.4    Recent Labs  04/24/16 0415  WBC 13.4*  RBC 4.08*  HCT 38.3*  PLT 261    Recent Labs  04/24/16 0415  NA 136  K 4.1  CL 106  CO2 24  BUN 13  CREATININE 0.91  GLUCOSE 123*  CALCIUM 8.5*    EXAM General - Patient is Alert and Oriented Extremity - Neurologically intact Intact pulses distally Dorsiflexion/Plantar flexion intact No cellulitis present Compartment soft Dressing - dressing C/D/I Motor Function - intact, moving foot and toes well on exam.  Hemovac pulled without difficulty.  Past Medical History  Diagnosis Date  . Hyperlipidemia   . Wears glasses   . Tinnitus   . History of urinary tract infection     35 years ago   . GERD (gastroesophageal reflux disease)     30 years ago   . History of hiatal hernia   . Arthritis     Assessment/Plan: 1 Day Post-Op Procedure(s) (LRB): LEFT TOTAL HIP ARTHROPLASTY ANTERIOR APPROACH (Left) Principal Problem:   OA (osteoarthritis) of hip  Estimated body mass index is 29.59 kg/(m^2) as calculated from the following:   Height as of  this encounter: 5\' 7"  (1.702 m).   Weight as of this encounter: 85.73 kg (189 lb). Advance diet Up with therapy D/C IV fluids Discharge home with home health  DVT Prophylaxis - Xarelto Weight Bearing As Tolerated  D/C Knee Immobilizer Hemovac Pulled   Therapy today then DC home.   Ardeen Jourdain, PA-C Orthopaedic Surgery 04/24/2016, 8:06 AM

## 2016-04-24 NOTE — Progress Notes (Signed)
Physical Therapy Treatment Patient Details Name: Jacob Griffith MRN: [id number] DOB: 05/12/1956 Today's Date: 05-21-2016    History of Present Illness Pt s/p L THR    PT Comments    Pt feeling much better this am and hopeful for dc home this pm.  Ambulation deferred to after tornado drill.  Follow Up Recommendations  Home health PT     Equipment Recommendations  None recommended by PT    Recommendations for Other Services OT consult     Precautions / Restrictions Precautions Precautions: Fall Restrictions Weight Bearing Restrictions: No    Mobility  Bed Mobility               General bed mobility comments: Pt OOB with OT and declines back to bed  Transfers                    Ambulation/Gait                 Stairs            Wheelchair Mobility    Modified Rankin (Stroke Patients Only)       Balance                                    Cognition Arousal/Alertness: Awake/alert Behavior During Therapy: WFL for tasks assessed/performed Overall Cognitive Status: Within Functional Limits for tasks assessed                      Exercises Total Joint Exercises Ankle Circles/Pumps: AROM;Both;15 reps;Supine Quad Sets: AROM;Both;10 reps;Supine Heel Slides: AAROM;Left;20 reps;Supine Hip ABduction/ADduction: AAROM;Left;15 reps;Supine Long Arc Quad: AROM;Left;10 reps;Supine    General Comments        Pertinent Vitals/Pain Pain Assessment: 0-10 Pain Score: 4  Pain Location: L hip and back Pain Descriptors / Indicators: Aching;Sore Pain Intervention(s): Limited activity within patient's tolerance;Monitored during session;Ice applied    Home Living                      Prior Function            PT Goals (current goals can now be found in the care plan section) Acute Rehab PT Goals Patient Stated Goal: Regain Independence PT Goal Formulation: With patient Time For Goal Achievement:  04/26/16 Potential to Achieve Goals: Good Progress towards PT goals: Progressing toward goals    Frequency  7X/week    PT Plan Current plan remains appropriate    Co-evaluation             End of Session   Activity Tolerance: Patient tolerated treatment well Patient left: in chair;with call bell/phone within reach;with family/visitor present     Time: [id number] PT Time Calculation (min) (ACUTE ONLY): 21 min  Charges:  $Therapeutic Exercise: 8-22 mins                    G Codes:      Jacob Griffith 21-May-2016, 12:35 PM

## 2016-04-24 NOTE — Discharge Summary (Signed)
Physician Discharge Summary   Patient ID: Jacob Griffith MRN: [id number] DOB/AGE: 1956/01/20 60 y.o.  Admit date: 04/23/2016 Discharge date: 04/24/2016  Primary Diagnosis: Primary osteoarthritis left hip   Admission Diagnoses:  Past Medical History  Diagnosis Date  . Hyperlipidemia   . Wears glasses   . Tinnitus   . History of urinary tract infection     35 years ago   . GERD (gastroesophageal reflux disease)     30 years ago   . History of hiatal hernia   . Arthritis    Discharge Diagnoses:   Principal Problem:   OA (osteoarthritis) of hip  Estimated body mass index is 29.59 kg/(m^2) as calculated from the following:   Height as of this encounter: '5\' 7"'$  (1.702 m).   Weight as of this encounter: 85.73 kg (189 lb).  Procedure(s) (LRB): LEFT TOTAL HIP ARTHROPLASTY ANTERIOR APPROACH (Left)   Consults: None  HPI: He has known bilateral hip arthritis but eh left hip is the most symptomatic and problematic of the two. He has undergone previous hip injections (three in the left hip and one in the right hip) which have not provided good relief. He states the left hip injection may have helped for a day or so and the right hip injection did help for a while. He has been putting up with the hip pain for quite some time. He was told by Dr. Wynelle Link aobut two years ago that he would likely need a hip replacement in the next few years and now he has reached that point at this time.  He has been limping for a long time which he feels has been afefecting his back. He was seen by Asencion Partridge, Dr. Rolena Infante' PA, recently for his back but it was felt that most of his pain was coming from his hip and was recommended to come back in to see Dr. Wynelle Link. He is at a point now, as long as Dr. Wynelle Link agrees, he is ready to proceed with surgical intervention for the left hip at this time. Jacob Griffith is at a stage now where the hip is bothering him at all times. Both hips are very symptomatic, but the left is worse  than the right. He has documented advanced arthritis of the hip. He has had multiple injections without benefit. Hips are having very negative factor in his life. Not only they are hurting him, but he has also lost lot of motion and lot of function. He is ready to proceed with surgery. They have been treated conservatively in the past for the above stated problem and despite conservative measures, they continue to have progressive pain and severe functional limitations and dysfunction. They have failed non-operative management including home exercise, medications. It is felt that they would benefit from undergoing total joint replacement. Risks and benefits of the procedure have been discussed with the patient and they elect to proceed with surgery. There are no active contraindications to surgery such as ongoing infection or rapidly progressive neurological disease.  Laboratory Data: Admission on 04/23/2016  Component Date Value Ref Range Status  . WBC 04/24/2016 13.4* 4.0 - 10.5 K/uL Final  . RBC 04/24/2016 4.08* 4.22 - 5.81 MIL/uL Final  . Hemoglobin 04/24/2016 13.4  13.0 - 17.0 g/dL Final  . HCT 04/24/2016 38.3* 39.0 - 52.0 % Final  . MCV 04/24/2016 93.9  78.0 - 100.0 fL Final  . MCH 04/24/2016 32.8  26.0 - 34.0 pg Final  . MCHC 04/24/2016 35.0  30.0 - 36.0 g/dL  Final  . RDW 04/24/2016 12.8  11.5 - 15.5 % Final  . Platelets 04/24/2016 261  150 - 400 K/uL Final  . Sodium 04/24/2016 136  135 - 145 mmol/L Final  . Potassium 04/24/2016 4.1  3.5 - 5.1 mmol/L Final  . Chloride 04/24/2016 106  101 - 111 mmol/L Final  . CO2 04/24/2016 24  22 - 32 mmol/L Final  . Glucose, Bld 04/24/2016 123* 65 - 99 mg/dL Final  . BUN 04/24/2016 13  6 - 20 mg/dL Final  . Creatinine, Ser 04/24/2016 0.91  0.61 - 1.24 mg/dL Final  . Calcium 04/24/2016 8.5* 8.9 - 10.3 mg/dL Final  . GFR calc non Af Amer 04/24/2016 >60  >60 mL/min Final  . GFR calc Af Amer 04/24/2016 >60  >60 mL/min Final   Comment: (NOTE) The eGFR  has been calculated using the CKD EPI equation. This calculation has not been validated in all clinical situations. eGFR's persistently <60 mL/min signify possible Chronic Kidney Disease.   Georgiann Hahn gap 04/24/2016 6  5 - 15 Final  Hospital Outpatient Visit on 04/17/2016  Component Date Value Ref Range Status  . aPTT 04/17/2016 31  24 - 37 seconds Final  . WBC 04/17/2016 7.3  4.0 - 10.5 K/uL Final  . RBC 04/17/2016 4.96  4.22 - 5.81 MIL/uL Final  . Hemoglobin 04/17/2016 16.6  13.0 - 17.0 g/dL Final  . HCT 04/17/2016 47.8  39.0 - 52.0 % Final  . MCV 04/17/2016 96.4  78.0 - 100.0 fL Final  . MCH 04/17/2016 33.5  26.0 - 34.0 pg Final  . MCHC 04/17/2016 34.7  30.0 - 36.0 g/dL Final  . RDW 04/17/2016 13.1  11.5 - 15.5 % Final  . Platelets 04/17/2016 277  150 - 400 K/uL Final  . Sodium 04/17/2016 136  135 - 145 mmol/L Final  . Potassium 04/17/2016 4.0  3.5 - 5.1 mmol/L Final  . Chloride 04/17/2016 102  101 - 111 mmol/L Final  . CO2 04/17/2016 27  22 - 32 mmol/L Final  . Glucose, Bld 04/17/2016 89  65 - 99 mg/dL Final  . BUN 04/17/2016 15  6 - 20 mg/dL Final  . Creatinine, Ser 04/17/2016 1.03  0.61 - 1.24 mg/dL Final  . Calcium 04/17/2016 9.6  8.9 - 10.3 mg/dL Final  . Total Protein 04/17/2016 8.1  6.5 - 8.1 g/dL Final  . Albumin 04/17/2016 4.8  3.5 - 5.0 g/dL Final  . AST 04/17/2016 28  15 - 41 U/L Final  . ALT 04/17/2016 28  17 - 63 U/L Final  . Alkaline Phosphatase 04/17/2016 73  38 - 126 U/L Final  . Total Bilirubin 04/17/2016 1.0  0.3 - 1.2 mg/dL Final  . GFR calc non Af Amer 04/17/2016 >60  >60 mL/min Final  . GFR calc Af Amer 04/17/2016 >60  >60 mL/min Final   Comment: (NOTE) The eGFR has been calculated using the CKD EPI equation. This calculation has not been validated in all clinical situations. eGFR's persistently <60 mL/min signify possible Chronic Kidney Disease.   . Anion gap 04/17/2016 7  5 - 15 Final  . Prothrombin Time 04/17/2016 13.3  11.6 - 15.2 seconds Final  .  INR 04/17/2016 1.03  0.00 - 1.49 Final  . ABO/RH(D) 04/17/2016 O POS   Final  . Antibody Screen 04/17/2016 NEG   Final  . Sample Expiration 04/17/2016 05/01/2016   Final  . Extend sample reason 04/17/2016 NO TRANSFUSIONS OR PREGNANCY IN THE PAST 3 MONTHS  Final  . Color, Urine 04/17/2016 YELLOW  YELLOW Final  . APPearance 04/17/2016 CLEAR  CLEAR Final  . Specific Gravity, Urine 04/17/2016 1.004* 1.005 - 1.030 Final  . pH 04/17/2016 6.5  5.0 - 8.0 Final  . Glucose, UA 04/17/2016 NEGATIVE  NEGATIVE mg/dL Final  . Hgb urine dipstick 04/17/2016 NEGATIVE  NEGATIVE Final  . Bilirubin Urine 04/17/2016 NEGATIVE  NEGATIVE Final  . Ketones, ur 04/17/2016 NEGATIVE  NEGATIVE mg/dL Final  . Protein, ur 04/17/2016 NEGATIVE  NEGATIVE mg/dL Final  . Nitrite 04/17/2016 NEGATIVE  NEGATIVE Final  . Leukocytes, UA 04/17/2016 NEGATIVE  NEGATIVE Final   MICROSCOPIC NOT DONE ON URINES WITH NEGATIVE PROTEIN, BLOOD, LEUKOCYTES, NITRITE, OR GLUCOSE <1000 mg/dL.  Marland Kitchen MRSA, PCR 04/17/2016 NEGATIVE  NEGATIVE Final  . Staphylococcus aureus 04/17/2016 NEGATIVE  NEGATIVE Final   Comment:        The Xpert SA Assay (FDA approved for NASAL specimens in patients over 63 years of age), is one component of a comprehensive surveillance program.  Test performance has been validated by [facility] for patients greater than or equal to 6 year old. It is not intended to diagnose infection nor to guide or monitor treatment.   . ABO/RH(D) 04/17/2016 O POS   Final     X-Rays:Dg Pelvis Portable  04/23/2016  CLINICAL DATA:  Postop left total hip replacement. EXAM: PORTABLE PELVIS 1-2 VIEWS COMPARISON:  None. FINDINGS: Examination demonstrates evidence of patient's recent total hip arthroplasty with prosthetic components intact and normally located. Surgical drain present over the adjacent soft tissues. Moderate degenerative changes of the right hip. IMPRESSION: Expected changes post left total hip arthroplasty. Moderate  degenerative change of the right hip. Electronically Signed   By: Marin Olp M.D.   On: 04/23/2016 12:26   Dg C-arm 1-60 Min-no Report  04/23/2016  CLINICAL DATA: surgery C-ARM 1-60 MINUTES Fluoroscopy was utilized by the requesting physician.  No radiographic interpretation.     Hospital Course: Patient was admitted to [facility] and taken to the OR and underwent the above state procedure without complications.  Patient tolerated the procedure well and was later transferred to the recovery room and then to the orthopaedic floor for postoperative care.  They were given PO and IV analgesics for pain control following their surgery.  They were given 24 hours of postoperative antibiotics of  Anti-infectives    Start     Dose/Rate Route Frequency Ordered Stop   04/23/16 2230  vancomycin (VANCOCIN) IVPB 1000 mg/200 mL premix     1,000 mg 200 mL/hr over 60 Minutes Intravenous Every 12 hours 04/23/16 1322 04/23/16 2347   04/23/16 0838  vancomycin (VANCOCIN) IVPB 1000 mg/200 mL premix     1,000 mg 200 mL/hr over 60 Minutes Intravenous On call to O.R. 04/23/16 1610 04/23/16 1028     and started on DVT prophylaxis in the form of Xarelto.   PT and OT were ordered for total hip protocol.  The patient was allowed to be WBAT with therapy. Discharge planning was consulted to help with postop disposition and equipment needs.  Patient had a good night on the evening of surgery.  They started to get up OOB with therapy on day one.  Hemovac drain was pulled without difficulty.  The knee immobilizer was removed and discontinued.   Patient was seen in rounds and was ready to go home.   Diet: Cardiac diet Activity:WBAT No bending hip over 90 degrees- A "L" Angle Do not cross legs Do  not let foot roll inward When turning these patients a pillow should be placed between the patient's legs to prevent crossing. Patients should have the affected knee fully extended when trying to sit or stand from all  surfaces to prevent excessive hip flexion. When ambulating and turning toward the affected side the affected leg should have the toes turned out prior to moving the walker and the rest of patient's body as to prevent internal rotation/ turning in of the leg. Abduction pillows are the most effective way to prevent a patient from not crossing legs or turning toes in at rest. If an abduction pillow is not ordered placing a regular pillow length wise between the patient's legs is also an effective reminder. It is imperative that these precautions be maintained so that the surgical hip does not dislocate. Follow-up:in 2 weeks Disposition - Home Discharged Condition: stable   Discharge Instructions    Call MD / Call 911    Complete by:  As directed   If you experience chest pain or shortness of breath, CALL 911 and be transported to the hospital emergency room.  If you develope a fever above 101 F, pus (white drainage) or increased drainage or redness at the wound, or calf pain, call your surgeon's office.     Constipation Prevention    Complete by:  As directed   Drink plenty of fluids.  Prune juice may be helpful.  You may use a stool softener, such as Colace (over the counter) 100 mg twice a day.  Use MiraLax (over the counter) for constipation as needed.     Diet - low sodium heart healthy    Complete by:  As directed      Increase activity slowly as tolerated    Complete by:  As directed             Medication List    STOP taking these medications        aspirin EC 81 MG tablet     GARLIC PO     GLUCOSAMINE CHONDROITIN JOINT PO     VITAMIN D-3 PO      TAKE these medications        ALPRAZolam 0.5 MG tablet  Commonly known as:  XANAX  Take 0.5 mg by mouth at bedtime as needed for sleep.     atorvastatin 20 MG tablet  Commonly known as:  LIPITOR  Take 20 mg by mouth daily.     gabapentin 300 MG capsule  Commonly known as:  NEURONTIN  Take 300 mg by mouth at bedtime as  needed (For pain.).     methocarbamol 500 MG tablet  Commonly known as:  ROBAXIN  Take 1 tablet (500 mg total) by mouth every 6 (six) hours as needed for muscle spasms.     rivaroxaban 10 MG Tabs tablet  Commonly known as:  XARELTO  Take 1 tablet (10 mg total) by mouth daily with breakfast.     traMADol 50 MG tablet  Commonly known as:  ULTRAM  Take 1-2 tablets (50-100 mg total) by mouth every 6 (six) hours as needed for moderate pain.           Follow-up Information    Follow up with Loanne Drilling, MD. Schedule an appointment as soon as possible for a visit on 05/06/2016.   Specialty:  Orthopedic Surgery   Contact information:   [address] [place] [address] [phone]       Signed: Joice Lofts  Cecilio Asper, PA-C Orthopaedic Surgery 04/24/2016, 8:07 AM

## 2016-04-24 NOTE — Progress Notes (Signed)
Discharge instructions given to patient along with scripts.  Questions answered 

## 2016-04-24 NOTE — Progress Notes (Signed)
Physical Therapy Treatment Patient Details Name: Jacob Griffith MRN: [id number] DOB: 12-Feb-1956 Today's Date: 05/17/2016    History of Present Illness Pt s/p L THR    PT Comments    Pt progressing well with mobility.  Will review stairs and car transfers this pm prior to pt dc.  Follow Up Recommendations  Home health PT     Equipment Recommendations  None recommended by PT    Recommendations for Other Services OT consult     Precautions / Restrictions Precautions Precautions: Fall Restrictions Weight Bearing Restrictions: No    Mobility  Bed Mobility               General bed mobility comments: Pt OOB with OT and declines back to bed  Transfers Overall transfer level: Needs assistance Equipment used: Rolling walker (2 wheeled) Transfers: Sit to/from Stand Sit to Stand: Min guard         General transfer comment: cues for UE/LE placement  Ambulation/Gait Ambulation/Gait assistance: Min guard Ambulation Distance (Feet): 200 Feet Assistive device: Rolling walker (2 wheeled) Gait Pattern/deviations: Step-to pattern;Step-through pattern;Shuffle;Trunk flexed Gait velocity: decr   General Gait Details: cues for posture, sequence and position from [employer] Mobility    Modified Rankin (Stroke Patients Only)       Balance                                    Cognition Arousal/Alertness: Awake/alert Behavior During Therapy: WFL for tasks assessed/performed Overall Cognitive Status: Within Functional Limits for tasks assessed                      Exercises Total Joint Exercises Ankle Circles/Pumps: AROM;Both;15 reps;Supine Quad Sets: AROM;Both;10 reps;Supine Heel Slides: AAROM;Left;20 reps;Supine Hip ABduction/ADduction: AAROM;Left;15 reps;Supine Long Arc Quad: AROM;Left;10 reps;Supine    General Comments        Pertinent Vitals/Pain Pain Assessment: 0-10 Pain Score: 4  Pain Location: L  hip and back Pain Descriptors / Indicators: Aching;Sore Pain Intervention(s): Limited activity within patient's tolerance;Monitored during session;Premedicated before session;Ice applied    Home Living                      Prior Function            PT Goals (current goals can now be found in the care plan section) Acute Rehab PT Goals Patient Stated Goal: Regain Independence PT Goal Formulation: With patient Time For Goal Achievement: 04/26/16 Potential to Achieve Goals: Good Progress towards PT goals: Progressing toward goals    Frequency  7X/week    PT Plan Current plan remains appropriate    Co-evaluation             End of Session Equipment Utilized During Treatment: Gait belt Activity Tolerance: Patient tolerated treatment well Patient left: in chair;with call bell/phone within reach;with family/visitor present     Time: [id number] PT Time Calculation (min) (ACUTE ONLY): 17 min  Charges:  $Gait Training: 8-22 mins $Therapeutic Exercise: 8-22 mins                    G Codes:      Handy Mcloud 2016/05/17, 12:41 PM

## 2016-04-24 NOTE — Progress Notes (Signed)
Physical Therapy Treatment Patient Details Name: Jacob Griffith MRN: [id number] DOB: 01/09/1956 Today's Date: 05/02/16    History of Present Illness Pt s/p L THR    PT Comments    Pt progressing well with mobility and eager for return home.  Reviewed stairs and car transfers with pt and wife.  Follow Up Recommendations  Home health PT     Equipment Recommendations  None recommended by PT    Recommendations for Other Services OT consult     Precautions / Restrictions Precautions Precautions: Fall Restrictions Weight Bearing Restrictions: No    Mobility  Bed Mobility Overal bed mobility: Needs Assistance Bed Mobility: Sit to Supine;Supine to Sit     Supine to sit: Supervision Sit to supine: Supervision      Transfers Overall transfer level: Needs assistance Equipment used: Rolling walker (2 wheeled) Transfers: Sit to/from Stand Sit to Stand: Supervision         General transfer comment: cues for UE/LE placement  Ambulation/Gait Ambulation/Gait assistance: Supervision Ambulation Distance (Feet): 100 Feet Assistive device: Rolling walker (2 wheeled) Gait Pattern/deviations: Step-to pattern;Step-through pattern;Shuffle;Trunk flexed Gait velocity: decr   General Gait Details: min cues for posture, sequence and position from RW   Stairs Stairs: Yes Stairs assistance: Min guard Stair Management: One rail Left;Step to pattern;Forwards;With cane Number of Stairs: 10 General stair comments: cues for sequence and foot/cane placement.  2 x 5 stairs with spouse assisting on 2nd attempt  Wheelchair Mobility    Modified Rankin (Stroke Patients Only)       Balance                                    Cognition Arousal/Alertness: Awake/alert Behavior During Therapy: WFL for tasks assessed/performed Overall Cognitive Status: Within Functional Limits for tasks assessed                      Exercises      General Comments         Pertinent Vitals/Pain Pain Assessment: 0-10 Pain Score: 3  Pain Location: L hip Pain Descriptors / Indicators: Aching;Sore Pain Intervention(s): Limited activity within patient's tolerance;Monitored during session;Ice applied    Home Living                      Prior Function            PT Goals (current goals can now be found in the care plan section) Acute Rehab PT Goals Patient Stated Goal: Regain Independence PT Goal Formulation: With patient Time For Goal Achievement: 04/26/16 Potential to Achieve Goals: Good Progress towards PT goals: Progressing toward goals    Frequency  7X/week    PT Plan Current plan remains appropriate    Co-evaluation             End of Session Equipment Utilized During Treatment: Gait belt Activity Tolerance: Patient tolerated treatment well Patient left: in bed;with call bell/phone within reach     Time: 1310-1328 PT Time Calculation (min) (ACUTE ONLY): 18 min  Charges:  $Gait Training: 8-22 mins                    G Codes:      Kaylla Cobos 05/02/16, 2:49 PM

## 2016-04-24 NOTE — Care Management Note (Signed)
Case Management Note  Patient Details  Name: Jacob Griffith MRN: [id number] Date of Birth: Nov 27, 1955  Subjective/Objective:                  LEFT TOTAL HIP ARTHROPLASTY ANTERIOR APPROACH (Left) Action/Plan: Discharge planning Expected Discharge Date:  04/24/16               Expected Discharge Plan:  [place]  In-House Referral:     Discharge planning Services  CM Consult  Post Acute Care Choice:    Choice offered to:     DME Arranged:  N/A DME Agency:  NA  HH Arranged:  PT HH Agency:  [facility]  Status of Service:  Completed, signed off  Medicare Important Message Given:    Date Medicare IM Given:    Medicare IM give by:    Date Additional Medicare IM Given:    Additional Medicare Important Message give by:     If discussed at [place] of Stay Meetings, dates discussed:    Additional Comments: CM met with pt in room to offer choice of home health agency.  Pt chooses [facility] to render HHPT.  Referral given to [facility] rep, Tim (on unit).  Pt has cane, rolling walker, and 3n1 at home.  No other CM needs were communicated. [name], RN 04/24/2016, 12:22 PM

## 2016-04-24 NOTE — Evaluation (Signed)
Occupational Therapy Evaluation Patient Details Name: Jacob Griffith MRN: [id number] DOB: 1956/01/19 Today's Date: 04/24/2016    History of Present Illness Pt s/p L THR   Clinical Impression   This 60 year old man was admitted for the above surgery. All education was completed.  No further OT is needed at this time    Follow Up Recommendations  No OT follow up;Supervision/Assistance - 24 hour    Equipment Recommendations  None recommended by OT    Recommendations for Other Services       Precautions / Restrictions Precautions Precautions: Fall Restrictions Weight Bearing Restrictions: No      Mobility Bed Mobility         Supine to sit: Min assist     General bed mobility comments: assist for LLE  Transfers   Equipment used: Rolling walker (2 wheeled) Transfers: Sit to/from Stand Sit to Stand: Min guard         General transfer comment: cues for UE/LE placement    Balance                                            ADL Overall ADL's : Needs assistance/impaired     Grooming: Oral care;Supervision/safety;Standing       Lower Body Bathing: Minimal assistance;Sit to/from stand       Lower Body Dressing: Moderate assistance;Sit to/from stand   Toilet Transfer: Min guard;Ambulation;BSC;RW   Toileting- Water quality scientist and Hygiene: Min guard;Sit to/from stand   Tub/ Shower Transfer: Walk-in shower;Min guard;Transfer board;3 in 1     General ADL Comments: ambulated to bathroom and practiced above transfers.  Educated on AE--pt has a Secondary school teacher at home:  explained ADL uses.  Sock aide tried twice and sock did not go on completely--may just want to have assistance from family     Vision     Perception     Praxis      Pertinent Vitals/Pain Pain Assessment: 0-10 Pain Score: 4  Pain Location: L hip and back Pain Descriptors / Indicators: Aching Pain Intervention(s): Limited activity within patient's tolerance;Monitored  during session;Premedicated before session;Repositioned;Ice applied     Hand Dominance Right   Extremity/Trunk Assessment Upper Extremity Assessment Upper Extremity Assessment: Overall WFL for tasks assessed           Communication Communication Communication: No difficulties   Cognition Arousal/Alertness: Awake/alert Behavior During Therapy: WFL for tasks assessed/performed Overall Cognitive Status: Within Functional Limits for tasks assessed                     General Comments       Exercises       Shoulder Instructions      Home Living Family/patient expects to be discharged to:: Private residence Living Arrangements: Spouse/significant other Available Help at Discharge: Family Type of Home: House Home Access: Stairs to enter Technical brewer of Steps: 3 Entrance Stairs-Rails: Right;Left Home Layout: Able to live on main level with bedroom/bathroom;Two level     Bathroom Shower/Tub: Occupational psychologist: Standard     Home Equipment: Toilet riser;Shower seat;Walker - 2 wheels;Cane - single point;Crutches          Prior Functioning/Environment Level of Independence: Independent             OT Diagnosis: Acute pain   OT Problem List:     OT Treatment/Interventions:  OT Goals(Current goals can be found in the care plan section) Acute Rehab OT Goals Patient Stated Goal: Regain Independence OT Goal Formulation: All assessment and education complete, DC therapy  OT Frequency:     Barriers to D/C:            Co-evaluation              End of Session    Activity Tolerance: Patient tolerated treatment well Patient left: in chair;with call bell/phone within reach   Time: 0757-0829 OT Time Calculation (min): 32 min Charges:  OT General Charges $OT Visit: 1 Procedure OT Evaluation $OT Eval Low Complexity: 1 Procedure OT Treatments $Self Care/Home Management : 8-22 mins G-Codes:     Daire Okimoto 05-13-2016, 8:56 AM  Lesle Chris, OTR/L [phone] 2016-05-13

## 2016-06-20 ENCOUNTER — Ambulatory Visit: Payer: Self-pay | Admitting: Orthopedic Surgery

## 2016-08-28 ENCOUNTER — Ambulatory Visit: Payer: Self-pay | Admitting: Orthopedic Surgery

## 2016-10-08 ENCOUNTER — Encounter (HOSPITAL_COMMUNITY)
Admission: RE | Admit: 2016-10-08 | Discharge: 2016-10-08 | Disposition: A | Discharge status: Home / Self Care | Payer: 59 | Source: Ambulatory Visit | Attending: Orthopedic Surgery | Admitting: Orthopedic Surgery

## 2016-10-08 ENCOUNTER — Encounter (HOSPITAL_COMMUNITY): Payer: Self-pay

## 2016-10-08 DIAGNOSIS — Z01812 Encounter for preprocedural laboratory examination: Secondary | ICD-10-CM | POA: Insufficient documentation

## 2016-10-08 DIAGNOSIS — M1611 Unilateral primary osteoarthritis, right hip: Secondary | ICD-10-CM | POA: Diagnosis not present

## 2016-10-08 DIAGNOSIS — Z0183 Encounter for blood typing: Secondary | ICD-10-CM | POA: Diagnosis not present

## 2016-10-08 LAB — CBC
HCT: 45.8 % (ref 39.0–52.0)
HEMOGLOBIN: 15.7 g/dL (ref 13.0–17.0)
MCH: 33.2 pg (ref 26.0–34.0)
MCHC: 34.3 g/dL (ref 30.0–36.0)
MCV: 96.8 fL (ref 78.0–100.0)
PLATELETS: 299 10*3/uL (ref 150–400)
RBC: 4.73 MIL/uL (ref 4.22–5.81)
RDW: 13.9 % (ref 11.5–15.5)
WBC: 9.1 10*3/uL (ref 4.0–10.5)

## 2016-10-08 LAB — URINALYSIS, ROUTINE W REFLEX MICROSCOPIC
BILIRUBIN URINE: NEGATIVE
GLUCOSE, UA: NEGATIVE mg/dL
HGB URINE DIPSTICK: NEGATIVE
Ketones, ur: NEGATIVE mg/dL
Leukocytes, UA: NEGATIVE
Nitrite: NEGATIVE
PROTEIN: NEGATIVE mg/dL
Specific Gravity, Urine: 1.016 (ref 1.005–1.030)
pH: 6.5 (ref 5.0–8.0)

## 2016-10-08 LAB — COMPREHENSIVE METABOLIC PANEL
ALBUMIN: 4.9 g/dL (ref 3.5–5.0)
ALT: 25 U/L (ref 17–63)
ANION GAP: 8 (ref 5–15)
AST: 31 U/L (ref 15–41)
Alkaline Phosphatase: 85 U/L (ref 38–126)
BUN: 23 mg/dL — ABNORMAL HIGH (ref 6–20)
CALCIUM: 9.9 mg/dL (ref 8.9–10.3)
CHLORIDE: 102 mmol/L (ref 101–111)
CO2: 28 mmol/L (ref 22–32)
Creatinine, Ser: 1.18 mg/dL (ref 0.61–1.24)
GFR calc non Af Amer: >60 mL/min (ref >60)
GLUCOSE: 98 mg/dL (ref 65–99)
POTASSIUM: 4.7 mmol/L (ref 3.5–5.1)
SODIUM: 138 mmol/L (ref 135–145)
Total Bilirubin: 1.2 mg/dL (ref 0.3–1.2)
Total Protein: 8 g/dL (ref 6.5–8.1)

## 2016-10-08 LAB — APTT: APTT: 35 s (ref 24–36)

## 2016-10-08 LAB — SURGICAL PCR SCREEN
MRSA, PCR: NEGATIVE
Staphylococcus aureus: NEGATIVE

## 2016-10-08 LAB — PROTIME-INR
INR: 0.94
Prothrombin Time: 12.5 seconds (ref 11.4–15.2)

## 2016-10-08 NOTE — Patient Instructions (Signed)
Jacob Griffith  10/08/2016   Your procedure is scheduled on: Wednesday October 15, 2016  Report to [facility] Main  Entrance take [place]  elevators to 3rd floor to  [place] at 6:45 AM.  Call this number if you have problems the morning of surgery [phone]   Remember: ONLY 1 PERSON MAY GO WITH YOU TO SHORT STAY TO GET  READY MORNING OF [place].  Do not eat food or drink liquids :After Midnight.     Take these medicines the morning of surgery: NONE                                 You may not have any metal on your body including hair pins and              piercings  Do not wear jewelry,  lotions, powders or colognes, deodorant                        Men may shave face and neck.   Do not bring valuables to the hospital. [place].  Contacts, dentures or bridgework may not be worn into surgery.  Leave suitcase in the car. After surgery it may be brought to your room.                Please read over the following fact sheets you were given:MRSA INFORMATION SHEET; INCENTIVE SPIROMETER; BLOOD TRANSFUSION INFORMATION SHEET  _____________________________________________________________________             [facility] - Preparing for Surgery Before surgery, you can play an important role.  Because skin is not sterile, your skin needs to be as free of germs as possible.  You can reduce the number of germs on your skin by washing with CHG (chlorahexidine gluconate) soap before surgery.  CHG is an antiseptic cleaner which kills germs and bonds with the skin to continue killing germs even after washing. Please DO NOT use if you have an allergy to CHG or antibacterial soaps.  If your skin becomes reddened/irritated stop using the CHG and inform your nurse when you arrive at Short Stay. Do not shave (including legs and underarms) for at least 48 hours prior to the first CHG shower.  You may shave your  face/neck. Please follow these instructions carefully:  1.  Shower with CHG Soap the night before surgery and the  morning of Surgery.  2.  If you choose to wash your hair, wash your hair first as usual with your  normal  shampoo.  3.  After you shampoo, rinse your hair and body thoroughly to remove the  shampoo.                           4.  Use CHG as you would any other liquid soap.  You can apply chg directly  to the skin and wash                       Gently with a scrungie or clean washcloth.  5.  Apply the CHG Soap to your body ONLY FROM THE NECK DOWN.   Do not  use on face/ open                           Wound or open sores. Avoid contact with eyes, ears mouth and genitals (private parts).                       Wash face,  Genitals (private parts) with your normal soap.             6.  Wash thoroughly, paying special attention to the area where your surgery  will be performed.  7.  Thoroughly rinse your body with warm water from the neck down.  8.  DO NOT shower/wash with your normal soap after using and rinsing off  the CHG Soap.                9.  Pat yourself dry with a clean towel.            10.  Wear clean pajamas.            11.  Place clean sheets on your bed the night of your first shower and do not  sleep with pets. Day of Surgery : Do not apply any lotions/deodorants the morning of surgery.  Please wear clean clothes to the hospital/surgery center.  FAILURE TO FOLLOW THESE INSTRUCTIONS MAY RESULT IN THE CANCELLATION OF YOUR SURGERY PATIENT SIGNATURE_________________________________  NURSE SIGNATURE__________________________________  ________________________________________________________________________   [name]  An incentive spirometer is a tool that can help keep your lungs clear and active. This tool measures how well you are filling your lungs with each breath. Taking long deep breaths may help reverse or decrease the chance of developing breathing  (pulmonary) problems (especially infection) following:  A long period of time when you are unable to move or be active. BEFORE THE PROCEDURE   If the spirometer includes an indicator to show your best effort, your nurse or respiratory therapist will set it to a desired goal.  If possible, sit up straight or lean slightly forward. Try not to slouch.  Hold the incentive spirometer in an upright position. INSTRUCTIONS FOR USE  1. Sit on the edge of your bed if possible, or sit up as far as you can in bed or on a chair. 2. Hold the incentive spirometer in an upright position. 3. Breathe out normally. 4. Place the mouthpiece in your mouth and seal your lips tightly around it. 5. Breathe in slowly and as deeply as possible, raising the piston or the ball toward the top of the column. 6. Hold your breath for 3-5 seconds or for as long as possible. Allow the piston or ball to fall to the bottom of the column. 7. Remove the mouthpiece from your mouth and breathe out normally. 8. Rest for a few seconds and repeat Steps 1 through 7 at least 10 times every 1-2 hours when you are awake. Take your time and take a few normal breaths between deep breaths. 9. The spirometer may include an indicator to show your best effort. Use the indicator as a goal to work toward during each repetition. 10. After each set of 10 deep breaths, practice coughing to be sure your lungs are clear. If you have an incision (the cut made at the time of surgery), support your incision when coughing by placing a pillow or rolled up towels firmly against it. Once you are able to get out of bed, walk  around indoors and cough well. You may stop using the incentive spirometer when instructed by your caregiver.  RISKS AND COMPLICATIONS  Take your time so you do not get dizzy or light-headed.  If you are in pain, you may need to take or ask for pain medication before doing incentive spirometry. It is harder to take a deep breath if you  are having pain. AFTER USE  Rest and breathe slowly and easily.  It can be helpful to keep track of a log of your progress. Your caregiver can provide you with a simple table to help with this. If you are using the spirometer at home, follow these instructions: [place] IF:   You are having difficultly using the spirometer.  You have trouble using the spirometer as often as instructed.  Your pain medication is not giving enough relief while using the spirometer.  You develop fever of 100.5 F (38.1 C) or higher. SEEK IMMEDIATE MEDICAL CARE IF:   You cough up bloody sputum that had not been present before.  You develop fever of 102 F (38.9 C) or greater.  You develop worsening pain at or near the incision site. MAKE SURE YOU:   Understand these instructions.  Will watch your condition.  Will get help right away if you are not doing well or get worse. Document Released: 03/16/2007 Document Revised: 01/26/2012 Document Reviewed: 05/17/2007 ExitCare Patient Information 2014 ExitCare, [place].   ________________________________________________________________________  WHAT IS A BLOOD TRANSFUSION? Blood Transfusion Information  A transfusion is the replacement of blood or some of its parts. Blood is made up of multiple cells which provide different functions.  Red blood cells carry oxygen and are used for blood loss replacement.  White blood cells fight against infection.  Platelets control bleeding.  Plasma helps clot blood.  Other blood products are available for specialized needs, such as hemophilia or other clotting disorders. BEFORE THE TRANSFUSION  Who gives blood for transfusions?   Healthy volunteers who are fully evaluated to make sure their blood is safe. This is blood bank blood. Transfusion therapy is the safest it has ever been in the practice of medicine. Before blood is taken from a donor, a complete history is taken to make sure that person has  no history of diseases nor engages in risky social behavior (examples are intravenous drug use or sexual activity with multiple partners). The donor's travel history is screened to minimize risk of transmitting infections, such as malaria. The donated blood is tested for signs of infectious diseases, such as HIV and hepatitis. The blood is then tested to be sure it is compatible with you in order to minimize the chance of a transfusion reaction. If you or a relative donates blood, this is often done in anticipation of surgery and is not appropriate for emergency situations. It takes many days to process the donated blood. RISKS AND COMPLICATIONS Although transfusion therapy is very safe and saves many lives, the main dangers of transfusion include:   Getting an infectious disease.  Developing a transfusion reaction. This is an allergic reaction to something in the blood you were given. Every precaution is taken to prevent this. The decision to have a blood transfusion has been considered carefully by your caregiver before blood is given. Blood is not given unless the benefits outweigh the risks. AFTER THE TRANSFUSION  Right after receiving a blood transfusion, you will usually feel much better and more energetic. This is especially true if your red blood cells have  gotten low (anemic). The transfusion raises the level of the red blood cells which carry oxygen, and this usually causes an energy increase.  The nurse administering the transfusion will monitor you carefully for complications. HOME CARE INSTRUCTIONS  No special instructions are needed after a transfusion. You may find your energy is better. Speak with your caregiver about any limitations on activity for underlying diseases you may have. SEEK MEDICAL CARE IF:   Your condition is not improving after your transfusion.  You develop redness or irritation at the intravenous (IV) site. SEEK IMMEDIATE MEDICAL CARE IF:  Any of the following  symptoms occur over the next 12 hours:  Shaking chills.  You have a temperature by mouth above 102 F (38.9 C), not controlled by medicine.  Chest, back, or muscle pain.  People around you feel you are not acting correctly or are confused.  Shortness of breath or difficulty breathing.  Dizziness and fainting.  You get a rash or develop hives.  You have a decrease in urine output.  Your urine turns a dark color or changes to pink, red, or brown. Any of the following symptoms occur over the next 10 days:  You have a temperature by mouth above 102 F (38.9 C), not controlled by medicine.  Shortness of breath.  Weakness after normal activity.  The white part of the eye turns yellow (jaundice).  You have a decrease in the amount of urine or are urinating less often.  Your urine turns a dark color or changes to pink, red, or brown. Document Released: 10/31/2000 Document Revised: 01/26/2012 Document Reviewed: 06/19/2008 [facility] Patient Information 2014 [place], [place].  _______________________________________________________________________

## 2016-10-08 NOTE — Progress Notes (Signed)
CMP results in epic per PAT visit 10/08/2016 sent to Dr Wynelle Link and Arlee Muslim PA

## 2016-10-08 NOTE — Progress Notes (Signed)
Clearance note per Dr Laurann Montana per Chart 05/08/2016

## 2016-10-14 ENCOUNTER — Ambulatory Visit: Payer: Self-pay | Admitting: Orthopedic Surgery

## 2016-10-14 NOTE — H&P (Signed)
Jacob Griffith DOB: [birth year] Married / Language: English / Race: White Male Date of Admission:  10/15/2016 CC:  Right Hip Pain History of Present Illness  The patient is a 60 year old male who comes in  for a preoperative History and Physical. The patient is scheduled for a right total hip arthroplasty (anterior) to be performed by Dr. Dione Griffith. [name], MD at [facility] on 10-15-2016. The patient is a 60 year old male who presented for follow up of their hip. The patient has been followed for their bilateral (left hip worse than right) hip pain and osteoarthritis. They are year(s) out from IA injection about 1 year ago. Symptoms reported include: pain, aching, stiffness and pain with weightbearing. The patient feels that they are doing poorly and report their pain level to be severe. Current treatment includes: activity modification. The following medication has been used for pain control: none. He has known bilateral hip arthritis but eh left hip was the most symptomatic and problematic of the two however he has just recently underwent surgical fixation of the left hip.  He has been limping for a long time which he feels has been afefecting his back. He was seen by Jacob Griffith, Dr. Rolena Griffith' PA, recently for his back but it was felt that most of his pain was coming from his hip and was recommended to come back in to see Dr. Wynelle Griffith.[name] is at a stage now where the hip is bothering him at all times. Both hips were very symptomatic. The left has been replaced and the right continues to be a problem. He has documented advanced arthritis of the hip. He has had multiple injections without benefit. Hips are having very negative factor in his life. Not only they are hurting him, but he has also lost lot of motion and lot of function. He is ready to proceed with surgery on the other side, the right hip at this time. They have been treated conservatively in the past for the above stated problem and  despite conservative measures, they continue to have progressive pain and severe functional limitations and dysfunction. They have failed non-operative management including home exercise, medications, and injections. It is felt that they would benefit from undergoing total joint replacement. Risks and benefits of the procedure have been discussed with the patient and they elect to proceed with surgery. There are no active contraindications to surgery such as ongoing infection or rapidly progressive neurological disease.    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 Joint Pain  Family History Drug / Alcohol Addiction  mother and grandfather fathers side Hypertension  father Osteoarthritis  grandmother mothers side Rheumatoid Arthritis  father and grandmother mothers side Cancer  Father. father  Social History  Alcohol use  current drinker; drinks beer; 8-14 per week Living situation  live with spouse Illicit drug use  no Tobacco use  Never smoker. 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 Garlic (Oral) Active. Vitamin D (1000UNIT Tablet, Oral) Active. Atorvastatin Calcium (10MG  Tablet, Oral) Active. Xanax (0.5MG  Tablet, Oral)  Active. Glucosamine Chondr 500 Complex (Oral) Active.  Past Surgical History Arthroscopy of Knee  left Carpal Tunnel Repair  left Vasectomy    Review of Systems General Not Present- Chills, Fatigue, Fever, Memory Loss, Night Sweats, Weight Gain and Weight Loss. Skin Not Present- Eczema, Hives, Itching, Lesions and Rash. HEENT Present- Hearing Loss and Tinnitus. Not Present- Dentures, Double Vision, Headache and Visual Loss. Respiratory Not Present- Allergies, Chronic Cough, Coughing up blood, Shortness of breath at rest and Shortness of breath with exertion. Cardiovascular Not Present- Chest Pain, Difficulty Breathing Lying Down, Murmur, Palpitations, Racing/skipping heartbeats and Swelling. Gastrointestinal Not Present- Abdominal Pain, Bloody Stool, Constipation, Diarrhea, Difficulty Swallowing, Heartburn, Jaundice, Loss of appetitie, Nausea and Vomiting. Male Genitourinary Not Present- Blood in Urine, Discharge, Flank Pain, Incontinence, Painful Urination, Urgency, Urinary frequency, Urinary Retention, Urinating at Night and Weak urinary stream. Musculoskeletal Present- Back Pain, Joint Pain and Morning Stiffness. Not Present- Joint Swelling, Muscle Pain, Muscle Weakness and Spasms. Neurological Not Present- Blackout spells, Difficulty with balance, Dizziness, Paralysis, Tremor and Weakness. Psychiatric Not Present- Insomnia.  Vitals  Weight: 190 lb Height: 66in Weight was reported by patient. Height was reported by patient. Body Surface Area: 1.96 m Body Mass Index: 30.67 kg/m  Pulse: 80 (Regular)  BP: 128/78 (Sitting, Right Arm, Standard)       Physical Exam General Mental Status -Alert, cooperative and good historian. General Appearance-pleasant, Not in acute distress. Orientation-Oriented X3. Build & Nutrition-Well nourished and Well developed.  Head and Neck Head-normocephalic, atraumatic . Neck Global Assessment - supple, no bruit  auscultated on the right, no bruit auscultated on the left.  Eye Vision-Wears corrective lenses. Pupil - Bilateral-Regular and Round. Motion - Bilateral-EOMI.  Chest and Lung Exam Auscultation Breath sounds - clear at anterior chest wall and clear at posterior chest wall. Adventitious sounds - No Adventitious sounds.  Cardiovascular Auscultation Rhythm - Regular rate and rhythm. Heart Sounds - S1 WNL and S2 WNL. Murmurs & Other Heart Sounds - Auscultation of the heart reveals - No Murmurs.  Abdomen Palpation/Percussion Tenderness - Abdomen is non-tender to palpation. Rigidity (guarding) - Abdomen is soft. Auscultation Auscultation of the abdomen reveals - Bowel sounds normal.  Male Genitourinary Note: Not done, not pertinent to present illness   Musculoskeletal Note: He is alert and oriented, no apparent distress. The right hip flex to about 100, minimal internal rotation, 20 external rotation, 20 abduction. Knee exam is normal bilaterally. Pulse, sensation and motor intact. He has a significantly antalgic gait pattern.  RADIOGRAPHS AP pelvis and lateral of hip show severe end stage arthritis of right hip, bone on bone in both with subchondral cystic formation and osteophyte formation.     Assessment & Plan  Aftercare following left hip joint replacement surgery (Z47.1, [id number]) Primary osteoarthritis of right hip (M16.11)  Note:Surgical Plans: Right Total Hip Replacement - Anterior Approach  Disposition: Home  PCP: Dr. Lavone Orn  IV TXA  Anesthesia Issues: None  Signed electronically by Ok Edwards, III PA-C

## 2016-10-15 ENCOUNTER — Inpatient Hospital Stay (HOSPITAL_COMMUNITY): Payer: 59 | Admitting: Anesthesiology

## 2016-10-15 ENCOUNTER — Inpatient Hospital Stay (HOSPITAL_COMMUNITY): Payer: 59

## 2016-10-15 ENCOUNTER — Inpatient Hospital Stay (HOSPITAL_COMMUNITY)
Admission: RE | Admit: 2016-10-15 | Discharge: 2016-10-16 | DRG: 470 | Disposition: A | Discharge status: Home Health | Payer: 59 | Source: Ambulatory Visit | Attending: Orthopedic Surgery | Admitting: Orthopedic Surgery

## 2016-10-15 ENCOUNTER — Encounter (HOSPITAL_COMMUNITY)
Admission: RE | Disposition: A | Discharge status: Home Health | Payer: Self-pay | Source: Ambulatory Visit | Attending: Orthopedic Surgery

## 2016-10-15 ENCOUNTER — Encounter (HOSPITAL_COMMUNITY): Payer: Self-pay | Admitting: *Deleted

## 2016-10-15 DIAGNOSIS — M1611 Unilateral primary osteoarthritis, right hip: Principal | ICD-10-CM | POA: Diagnosis present

## 2016-10-15 DIAGNOSIS — Z87891 Personal history of nicotine dependence: Secondary | ICD-10-CM | POA: Diagnosis not present

## 2016-10-15 DIAGNOSIS — E785 Hyperlipidemia, unspecified: Secondary | ICD-10-CM | POA: Diagnosis present

## 2016-10-15 DIAGNOSIS — Z880 Allergy status to penicillin: Secondary | ICD-10-CM | POA: Diagnosis not present

## 2016-10-15 DIAGNOSIS — Z96649 Presence of unspecified artificial hip joint: Secondary | ICD-10-CM

## 2016-10-15 DIAGNOSIS — Z96642 Presence of left artificial hip joint: Secondary | ICD-10-CM | POA: Diagnosis present

## 2016-10-15 DIAGNOSIS — Z96641 Presence of right artificial hip joint: Secondary | ICD-10-CM | POA: Diagnosis present

## 2016-10-15 DIAGNOSIS — M169 Osteoarthritis of hip, unspecified: Secondary | ICD-10-CM | POA: Diagnosis present

## 2016-10-15 DIAGNOSIS — M25551 Pain in right hip: Secondary | ICD-10-CM | POA: Diagnosis present

## 2016-10-15 HISTORY — PX: TOTAL HIP ARTHROPLASTY: SHX124

## 2016-10-15 LAB — TYPE AND SCREEN
ABO/RH(D): O POS
ANTIBODY SCREEN: NEGATIVE

## 2016-10-15 SURGERY — ARTHROPLASTY, HIP, TOTAL, ANTERIOR APPROACH
Anesthesia: Monitor Anesthesia Care | Site: Hip | Laterality: Right

## 2016-10-15 MED ORDER — MIDAZOLAM HCL 2 MG/2ML IJ SOLN
INTRAMUSCULAR | Status: AC
Start: 2016-10-15 — End: 2016-10-15
  Filled 2016-10-15: qty 2

## 2016-10-15 MED ORDER — FLEET ENEMA 7-19 GM/118ML RE ENEM: 1.0000 | ENEMA | Freq: Once | RECTAL | Status: DC | PRN

## 2016-10-15 MED ORDER — MENTHOL 3 MG MT LOZG: 1.0000 | LOZENGE | OROMUCOSAL | Status: DC | PRN

## 2016-10-15 MED ORDER — ONDANSETRON HCL 4 MG PO TABS: 4.0000 mg | ORAL_TABLET | Freq: Four times a day (QID) | ORAL | Status: DC | PRN

## 2016-10-15 MED ORDER — BISACODYL 10 MG RE SUPP: 10.0000 mg | Freq: Every day | RECTAL | Status: DC | PRN

## 2016-10-15 MED ORDER — PHENYLEPHRINE HCL 10 MG/ML IJ SOLN
INTRAMUSCULAR | Status: AC
  Filled 2016-10-15: qty 1

## 2016-10-15 MED ORDER — OXYCODONE HCL 5 MG PO TABS
5.0000 mg | ORAL_TABLET | ORAL | Status: DC | PRN
  Administered 2016-10-15: 5 mg via ORAL
  Administered 2016-10-15 – 2016-10-16 (×3): 10 mg via ORAL
  Filled 2016-10-15: qty 2
  Filled 2016-10-15: qty 1
  Filled 2016-10-15 (×3): qty 2

## 2016-10-15 MED ORDER — METOCLOPRAMIDE HCL 5 MG PO TABS: 5.0000 mg | ORAL_TABLET | Freq: Three times a day (TID) | ORAL | Status: DC | PRN

## 2016-10-15 MED ORDER — FENTANYL CITRATE (PF) 100 MCG/2ML IJ SOLN: 25.0000 ug | INTRAMUSCULAR | Status: DC | PRN

## 2016-10-15 MED ORDER — ONDANSETRON HCL 4 MG/2ML IJ SOLN
INTRAMUSCULAR | Status: DC | PRN
  Administered 2016-10-15: 4 mg via INTRAVENOUS

## 2016-10-15 MED ORDER — RIVAROXABAN 10 MG PO TABS
10.0000 mg | ORAL_TABLET | Freq: Every day | ORAL | Status: DC
  Administered 2016-10-16: 10 mg via ORAL
  Filled 2016-10-15: qty 1

## 2016-10-15 MED ORDER — PHENOL 1.4 % MT LIQD: 1.0000 | OROMUCOSAL | Status: DC | PRN

## 2016-10-15 MED ORDER — ACETAMINOPHEN 650 MG RE SUPP: 650.0000 mg | Freq: Four times a day (QID) | RECTAL | Status: DC | PRN

## 2016-10-15 MED ORDER — LACTATED RINGERS IV SOLN
INTRAVENOUS | Status: DC
  Administered 2016-10-15 (×2): via INTRAVENOUS

## 2016-10-15 MED ORDER — BUPIVACAINE HCL (PF) 0.25 % IJ SOLN
INTRAMUSCULAR | Status: DC | PRN
  Administered 2016-10-15: 30 mL

## 2016-10-15 MED ORDER — ALPRAZOLAM 0.5 MG PO TABS
0.5000 mg | ORAL_TABLET | Freq: Every evening | ORAL | Status: DC | PRN
  Administered 2016-10-15: 0.5 mg via ORAL
  Filled 2016-10-15: qty 1

## 2016-10-15 MED ORDER — TRANEXAMIC ACID 1000 MG/10ML IV SOLN
1000.0000 mg | INTRAVENOUS | Status: AC
  Administered 2016-10-15: 1000 mg via INTRAVENOUS
  Filled 2016-10-15: qty 1100

## 2016-10-15 MED ORDER — POLYETHYLENE GLYCOL 3350 17 G PO PACK: 17.0000 g | PACK | Freq: Every day | ORAL | Status: DC | PRN

## 2016-10-15 MED ORDER — PHENYLEPHRINE 40 MCG/ML (10ML) SYRINGE FOR IV PUSH (FOR BLOOD PRESSURE SUPPORT)
PREFILLED_SYRINGE | INTRAVENOUS | Status: AC
  Filled 2016-10-15: qty 10

## 2016-10-15 MED ORDER — DEXAMETHASONE SODIUM PHOSPHATE 10 MG/ML IJ SOLN
INTRAMUSCULAR | Status: AC
  Filled 2016-10-15: qty 1

## 2016-10-15 MED ORDER — TRAMADOL HCL 50 MG PO TABS: 50.0000 mg | ORAL_TABLET | Freq: Four times a day (QID) | ORAL | Status: DC | PRN

## 2016-10-15 MED ORDER — MIDAZOLAM HCL 5 MG/5ML IJ SOLN
INTRAMUSCULAR | Status: DC | PRN
  Administered 2016-10-15: 2 mg via INTRAVENOUS

## 2016-10-15 MED ORDER — ACETAMINOPHEN 500 MG PO TABS
1000.0000 mg | ORAL_TABLET | Freq: Four times a day (QID) | ORAL | Status: DC
  Administered 2016-10-15 – 2016-10-16 (×2): 1000 mg via ORAL
  Filled 2016-10-15 (×3): qty 2

## 2016-10-15 MED ORDER — ACETAMINOPHEN 10 MG/ML IV SOLN
1000.0000 mg | Freq: Once | INTRAVENOUS | Status: AC
  Administered 2016-10-15: 1000 mg via INTRAVENOUS

## 2016-10-15 MED ORDER — LIDOCAINE 2% (20 MG/ML) 5 ML SYRINGE
INTRAMUSCULAR | Status: AC
  Filled 2016-10-15: qty 5

## 2016-10-15 MED ORDER — ACETAMINOPHEN 325 MG PO TABS: 650.0000 mg | ORAL_TABLET | Freq: Four times a day (QID) | ORAL | Status: DC | PRN

## 2016-10-15 MED ORDER — CHLORHEXIDINE GLUCONATE 4 % EX LIQD: 60.0000 mL | Freq: Once | CUTANEOUS | Status: DC

## 2016-10-15 MED ORDER — DEXAMETHASONE SODIUM PHOSPHATE 10 MG/ML IJ SOLN: 10.0000 mg | Freq: Once | INTRAMUSCULAR | Status: DC

## 2016-10-15 MED ORDER — ATORVASTATIN CALCIUM 20 MG PO TABS
20.0000 mg | ORAL_TABLET | Freq: Every day | ORAL | Status: DC
Start: 2016-10-16 — End: 2016-10-16
  Administered 2016-10-16: 20 mg via ORAL
  Filled 2016-10-15: qty 1

## 2016-10-15 MED ORDER — OXYCODONE HCL 5 MG PO TABS: 5.0000 mg | ORAL_TABLET | Freq: Once | ORAL | Status: DC | PRN

## 2016-10-15 MED ORDER — DIPHENHYDRAMINE HCL 12.5 MG/5ML PO ELIX: 12.5000 mg | ORAL_SOLUTION | ORAL | Status: DC | PRN

## 2016-10-15 MED ORDER — BUPIVACAINE HCL (PF) 0.5 % IJ SOLN
INTRAMUSCULAR | Status: DC | PRN
  Administered 2016-10-15: 15 mg via INTRATHECAL

## 2016-10-15 MED ORDER — BUPIVACAINE HCL (PF) 0.25 % IJ SOLN
INTRAMUSCULAR | Status: AC
  Filled 2016-10-15: qty 30

## 2016-10-15 MED ORDER — SODIUM CHLORIDE 0.9 % IV SOLN
INTRAVENOUS | Status: DC
  Administered 2016-10-15 – 2016-10-16 (×2): via INTRAVENOUS

## 2016-10-15 MED ORDER — PROPOFOL 10 MG/ML IV BOLUS
INTRAVENOUS | Status: AC
  Filled 2016-10-15: qty 40

## 2016-10-15 MED ORDER — PHENYLEPHRINE HCL 10 MG/ML IJ SOLN
INTRAMUSCULAR | Status: DC | PRN
  Administered 2016-10-15 (×5): 80 ug via INTRAVENOUS

## 2016-10-15 MED ORDER — OXYCODONE HCL 5 MG/5ML PO SOLN: 5.0000 mg | Freq: Once | ORAL | Status: DC | PRN

## 2016-10-15 MED ORDER — SUCCINYLCHOLINE CHLORIDE 200 MG/10ML IV SOSY
PREFILLED_SYRINGE | INTRAVENOUS | Status: AC
  Filled 2016-10-15: qty 10

## 2016-10-15 MED ORDER — ACETAMINOPHEN 10 MG/ML IV SOLN
INTRAVENOUS | Status: AC
  Filled 2016-10-15: qty 100

## 2016-10-15 MED ORDER — TRANEXAMIC ACID 1000 MG/10ML IV SOLN
1000.0000 mg | Freq: Once | INTRAVENOUS | Status: AC
  Administered 2016-10-15: 1000 mg via INTRAVENOUS
  Filled 2016-10-15: qty 1100
  Filled 2016-10-15: qty 10

## 2016-10-15 MED ORDER — DOCUSATE SODIUM 100 MG PO CAPS
100.0000 mg | ORAL_CAPSULE | Freq: Two times a day (BID) | ORAL | Status: DC
  Administered 2016-10-15 – 2016-10-16 (×2): 100 mg via ORAL
  Filled 2016-10-15 (×2): qty 1

## 2016-10-15 MED ORDER — ONDANSETRON HCL 4 MG/2ML IJ SOLN: 4.0000 mg | Freq: Four times a day (QID) | INTRAMUSCULAR | Status: DC | PRN

## 2016-10-15 MED ORDER — METHOCARBAMOL 1000 MG/10ML IJ SOLN
500.0000 mg | Freq: Four times a day (QID) | INTRAVENOUS | Status: DC | PRN
  Filled 2016-10-15: qty 5

## 2016-10-15 MED ORDER — MORPHINE SULFATE (PF) 2 MG/ML IV SOLN
1.0000 mg | INTRAVENOUS | Status: DC | PRN
  Administered 2016-10-16: 1 mg via INTRAVENOUS
  Filled 2016-10-15: qty 1

## 2016-10-15 MED ORDER — FENTANYL CITRATE (PF) 100 MCG/2ML IJ SOLN
INTRAMUSCULAR | Status: AC
  Filled 2016-10-15: qty 2

## 2016-10-15 MED ORDER — VANCOMYCIN HCL IN DEXTROSE 1-5 GM/200ML-% IV SOLN
1000.0000 mg | Freq: Two times a day (BID) | INTRAVENOUS | Status: AC
  Administered 2016-10-15: 1000 mg via INTRAVENOUS
  Filled 2016-10-15: qty 200

## 2016-10-15 MED ORDER — PROPOFOL 10 MG/ML IV BOLUS
INTRAVENOUS | Status: AC
  Filled 2016-10-15: qty 20

## 2016-10-15 MED ORDER — ROCURONIUM BROMIDE 50 MG/5ML IV SOSY
PREFILLED_SYRINGE | INTRAVENOUS | Status: AC
  Filled 2016-10-15: qty 5

## 2016-10-15 MED ORDER — VANCOMYCIN HCL IN DEXTROSE 1-5 GM/200ML-% IV SOLN
1000.0000 mg | INTRAVENOUS | Status: AC
  Administered 2016-10-15: 1000 mg via INTRAVENOUS
  Filled 2016-10-15: qty 200

## 2016-10-15 MED ORDER — METHOCARBAMOL 500 MG PO TABS
500.0000 mg | ORAL_TABLET | Freq: Four times a day (QID) | ORAL | Status: DC | PRN
  Administered 2016-10-15 – 2016-10-16 (×2): 500 mg via ORAL
  Filled 2016-10-15 (×2): qty 1

## 2016-10-15 MED ORDER — ONDANSETRON HCL 4 MG/2ML IJ SOLN
INTRAMUSCULAR | Status: AC
  Filled 2016-10-15: qty 2

## 2016-10-15 MED ORDER — LACTATED RINGERS IV SOLN: INTRAVENOUS | Status: DC

## 2016-10-15 MED ORDER — BUPIVACAINE HCL (PF) 0.5 % IJ SOLN
INTRAMUSCULAR | Status: AC
  Filled 2016-10-15: qty 30

## 2016-10-15 MED ORDER — SODIUM CHLORIDE 0.9 % IV SOLN
INTRAVENOUS | Status: DC | PRN
  Administered 2016-10-15: 25 ug/min via INTRAVENOUS

## 2016-10-15 MED ORDER — FENTANYL CITRATE (PF) 100 MCG/2ML IJ SOLN
INTRAMUSCULAR | Status: DC | PRN
  Administered 2016-10-15: 100 ug via INTRAVENOUS

## 2016-10-15 MED ORDER — DEXAMETHASONE SODIUM PHOSPHATE 10 MG/ML IJ SOLN
INTRAMUSCULAR | Status: DC | PRN
  Administered 2016-10-15: 10 mg via INTRAVENOUS

## 2016-10-15 MED ORDER — ACETAMINOPHEN 10 MG/ML IV SOLN
1000.0000 mg | Freq: Once | INTRAVENOUS | Status: DC
  Filled 2016-10-15: qty 100

## 2016-10-15 MED ORDER — METOCLOPRAMIDE HCL 5 MG/ML IJ SOLN: 5.0000 mg | Freq: Three times a day (TID) | INTRAMUSCULAR | Status: DC | PRN

## 2016-10-15 MED ORDER — PROPOFOL 500 MG/50ML IV EMUL
INTRAVENOUS | Status: DC | PRN
  Administered 2016-10-15: 100 ug/kg/min via INTRAVENOUS

## 2016-10-15 SURGICAL SUPPLY — 36 items
BAG DECANTER FOR FLEXI CONT (MISCELLANEOUS) ×3 IMPLANT
BAG SPEC THK2 15X12 ZIP CLS (MISCELLANEOUS)
BAG ZIPLOCK 12X15 (MISCELLANEOUS) IMPLANT
BLADE SAG 18X100X1.27 (BLADE) ×3 IMPLANT
CAPT HIP TOTAL 2 ×2 IMPLANT
CLOSURE WOUND 1/2 X4 (GAUZE/BANDAGES/DRESSINGS) ×1
CLOTH BEACON ORANGE TIMEOUT ST (SAFETY) ×3 IMPLANT
COVER PERINEAL POST (MISCELLANEOUS) ×3 IMPLANT
DECANTER SPIKE VIAL GLASS SM (MISCELLANEOUS) ×3 IMPLANT
DRAPE STERI IOBAN 125X83 (DRAPES) ×3 IMPLANT
DRAPE U-SHAPE 47X51 STRL (DRAPES) ×6 IMPLANT
DRSG ADAPTIC 3X8 NADH LF (GAUZE/BANDAGES/DRESSINGS) ×3 IMPLANT
DRSG MEPILEX BORDER 4X4 (GAUZE/BANDAGES/DRESSINGS) ×3 IMPLANT
DRSG MEPILEX BORDER 4X8 (GAUZE/BANDAGES/DRESSINGS) ×3 IMPLANT
DURAPREP 26ML APPLICATOR (WOUND CARE) ×3 IMPLANT
ELECT REM PT RETURN 9FT ADLT (ELECTROSURGICAL) ×3
ELECTRODE REM PT RTRN 9FT ADLT (ELECTROSURGICAL) ×1 IMPLANT
EVACUATOR 1/8 PVC DRAIN (DRAIN) ×3 IMPLANT
GLOVE BIO SURGEON STRL SZ7.5 (GLOVE) ×3 IMPLANT
GLOVE BIO SURGEON STRL SZ8 (GLOVE) ×6 IMPLANT
GLOVE BIOGEL PI IND STRL 8 (GLOVE) ×2 IMPLANT
GLOVE BIOGEL PI INDICATOR 8 (GLOVE) ×4
GOWN STRL REUS W/TWL LRG LVL3 (GOWN DISPOSABLE) ×3 IMPLANT
GOWN STRL REUS W/TWL XL LVL3 (GOWN DISPOSABLE) ×3 IMPLANT
NS IRRIG 1000ML POUR BTL (IV SOLUTION) ×2 IMPLANT
PACK ANTERIOR HIP CUSTOM (KITS) ×3 IMPLANT
STRIP CLOSURE SKIN 1/2X4 (GAUZE/BANDAGES/DRESSINGS) ×2 IMPLANT
SUT ETHIBOND NAB CT1 #1 30IN (SUTURE) ×3 IMPLANT
SUT MNCRL AB 4-0 PS2 18 (SUTURE) ×3 IMPLANT
SUT VIC AB 2-0 CT1 27 (SUTURE) ×6
SUT VIC AB 2-0 CT1 TAPERPNT 27 (SUTURE) ×2 IMPLANT
SUT VLOC 180 0 24IN GS25 (SUTURE) ×3 IMPLANT
SYR 50ML LL SCALE MARK (SYRINGE) IMPLANT
TRAY FOLEY W/METER SILVER 16FR (SET/KITS/TRAYS/PACK) ×3 IMPLANT
WATER STERILE IRR 1000ML POUR (IV SOLUTION) ×4 IMPLANT
YANKAUER SUCT BULB TIP 10FT TU (MISCELLANEOUS) ×3 IMPLANT

## 2016-10-15 NOTE — Interval H&P Note (Signed)
History and Physical Interval Note:  10/15/2016 8:51 AM  Jacob Griffith  has presented today for surgery, with the diagnosis of RIGHT HIP OA  The various methods of treatment have been discussed with the patient and family. After consideration of risks, benefits and other options for treatment, the patient has consented to  Procedure(s): RIGHT TOTAL HIP ARTHROPLASTY ANTERIOR APPROACH (Right) as a surgical intervention .  The patient's history has been reviewed, patient examined, no change in status, stable for surgery.  I have reviewed the patient's chart and labs.  Questions were answered to the patient's satisfaction.     Gearlean Alf

## 2016-10-15 NOTE — Anesthesia Postprocedure Evaluation (Signed)
Anesthesia Post Note  Patient: Jacob Griffith  Procedure(s) Performed: Procedure(s) (LRB): RIGHT TOTAL HIP ARTHROPLASTY ANTERIOR APPROACH (Right)  Patient location during evaluation: PACU Anesthesia Type: Spinal Level of consciousness: oriented and awake and alert Pain management: pain level controlled Vital Signs Assessment: post-procedure vital signs reviewed and stable Respiratory status: spontaneous breathing, respiratory function stable and patient connected to nasal cannula oxygen Cardiovascular status: blood pressure returned to baseline and stable Postop Assessment: no headache and no backache Anesthetic complications: no    Last Vitals:  Vitals:   10/15/16 1230 10/15/16 1245  BP: 136/88 128/83  Pulse: (!) 50 (!) 50  Resp: 15 12  Temp:  36.4 C    Last Pain:  Vitals:   10/15/16 1230  TempSrc:   PainSc: 0-No pain    LLE Motor Response: Non-purposeful movement (10/15/16 1245) LLE Sensation: Decreased (10/15/16 1245) RLE Motor Response: Non-purposeful movement (10/15/16 1245) RLE Sensation: Decreased (10/15/16 1245) L Sensory Level: L2-Upper inner thigh, upper buttock (10/15/16 1245) R Sensory Level: L2-Upper inner thigh, upper buttock (10/15/16 1245)  [place]

## 2016-10-15 NOTE — Anesthesia Preprocedure Evaluation (Signed)
Anesthesia Evaluation  Patient identified by MRN, date of birth, ID band Patient awake    Reviewed: Allergy & Precautions, H&P , NPO status , Patient's Chart, lab work & pertinent test results  Airway Mallampati: II   Neck ROM: full    Dental   Pulmonary former smoker,    breath sounds clear to auscultation       Cardiovascular negative cardio ROS   Rhythm:regular Rate:Normal     Neuro/Psych    GI/Hepatic GERD  ,  Endo/Other    Renal/GU      Musculoskeletal  (+) Arthritis ,   Abdominal   Peds  Hematology   Anesthesia Other Findings   Reproductive/Obstetrics                             Anesthesia Physical Anesthesia Plan  ASA: II  Anesthesia Plan: MAC and Spinal   Post-op Pain Management:    Induction: Intravenous  Airway Management Planned: Simple Face Mask  Additional Equipment:   Intra-op Plan:   Post-operative Plan:   Informed Consent: I have reviewed the patients History and Physical, chart, labs and discussed the procedure including the risks, benefits and alternatives for the proposed anesthesia with the patient or authorized representative who has indicated his/her understanding and acceptance.     Plan Discussed with: CRNA, Anesthesiologist and Surgeon  Anesthesia Plan Comments:         Anesthesia Quick Evaluation

## 2016-10-15 NOTE — Addendum Note (Signed)
Addendum  created 10/15/16 1328 by Lind Covert, CRNA   Anesthesia Intra Blocks edited, Child order released for a procedure order, Sign clinical note

## 2016-10-15 NOTE — Evaluation (Signed)
Physical Therapy Evaluation Patient Details Name: Jacob Griffith MRN: [id number] DOB: January 21, 1956 Today's Date: 10/15/2016   History of Present Illness  Pt s/p R THR and with hx of L THR 6/17  Clinical Impression  Pt s/p R THR presents with decreased R LE strength/ROM and post op pain limiting functional mobility.  Pt should progress well to dc home with family assist and HHPT follow up.    Follow Up Recommendations Home health PT    Equipment Recommendations  None recommended by PT    Recommendations for Other Services OT consult     Precautions / Restrictions Precautions Precautions: Fall Restrictions Weight Bearing Restrictions: No Other Position/Activity Restrictions: WBAT      Mobility  Bed Mobility Overal bed mobility: Needs Assistance Bed Mobility: Supine to Sit     Supine to sit: Min guard     General bed mobility comments: Min cues for sequence and use of L LE to self assist  Transfers Overall transfer level: Needs assistance Equipment used: Rolling walker (2 wheeled) Transfers: Sit to/from Stand Sit to Stand: Min assist         General transfer comment: cues for LE management and use of UEs to self assist  Ambulation/Gait Ambulation/Gait assistance: Min assist Ambulation Distance (Feet): 123 Feet Assistive device: Rolling walker (2 wheeled) Gait Pattern/deviations: Step-to pattern;Step-through pattern;Decreased step length - right;Decreased step length - left;Shuffle;Trunk flexed Gait velocity: decr Gait velocity interpretation: Below normal speed for age/gender General Gait Details: cues for posture, position from RW and initial sequence  Stairs            Wheelchair Mobility    Modified Rankin (Stroke Patients Only)       Balance                                             Pertinent Vitals/Pain Pain Assessment: 0-10 Pain Score: 4  Pain Location: r HIP Pain Descriptors / Indicators: Aching;Sore Pain  Intervention(s): Premedicated before session;Limited activity within patient's tolerance;Monitored during session;Ice applied    Home Living Family/patient expects to be discharged to:: Private residence Living Arrangements: Spouse/significant other Available Help at Discharge: Family Type of Home: House Home Access: Stairs to enter Entrance Stairs-Rails: Psychiatric nurse of Steps: 3 Home Layout: Able to live on main level with bedroom/bathroom;Two level Home Equipment: Toilet riser;Shower seat;Walker - 2 wheels;Cane - single point;Crutches      Prior Function Level of Independence: Independent               Hand Dominance   Dominant Hand: Right    Extremity/Trunk Assessment   Upper Extremity Assessment: Overall WFL for tasks assessed           Lower Extremity Assessment: RLE deficits/detail      Cervical / Trunk Assessment: Normal  Communication   Communication: No difficulties  Cognition Arousal/Alertness: Awake/alert Behavior During Therapy: WFL for tasks assessed/performed Overall Cognitive Status: Within Functional Limits for tasks assessed                      General Comments      Exercises Total Joint Exercises Ankle Circles/Pumps: AROM;Both;15 reps;Supine   Assessment/Plan    PT Assessment Patient needs continued PT services  PT Problem List Decreased strength;Decreased range of motion;Decreased activity tolerance;Decreased mobility;Pain;Decreased knowledge of use of DME  PT Treatment Interventions DME instruction;Gait training;Stair training;Functional mobility training;Therapeutic activities;Therapeutic exercise;Patient/family education    PT Goals (Current goals can be found in the Care Plan section)  Acute Rehab PT Goals Patient Stated Goal: Regain IND and get back to work PT Goal Formulation: With patient Time For Goal Achievement: 10/17/16 Potential to Achieve Goals: Good    Frequency 7X/week    Barriers to discharge        Co-evaluation               End of Session Equipment Utilized During Treatment: Gait belt Activity Tolerance: Patient tolerated treatment well Patient left: in chair;with call bell/phone within reach;with family/visitor present Nurse Communication: Mobility status         Time: [id number] PT Time Calculation (min) (ACUTE ONLY): 29 min   Charges:   PT Evaluation $PT Eval Low Complexity: 1 Procedure PT Treatments $Gait Training: 8-22 mins   PT G Codes:        Naileah Karg 05-Nov-2016, 4:49 PM

## 2016-10-15 NOTE — Transfer of Care (Signed)
Immediate Anesthesia Transfer of Care Note  Patient: Jacob Griffith  Procedure(s) Performed: Procedure(s): RIGHT TOTAL HIP ARTHROPLASTY ANTERIOR APPROACH (Right)  Patient Location: PACU  Anesthesia Type:Spinal  Level of Consciousness: awake, alert  and oriented  Airway & Oxygen Therapy: Patient Spontanous Breathing  Post-op Assessment: Report given to RN and Post -op Vital signs reviewed and stable  Post vital signs: Reviewed and stable  Last Vitals:  Vitals:   10/15/16 0655  BP: 125/82  Pulse: 79  Resp: 16  Temp: 36.7 C    Last Pain:  Vitals:   10/15/16 0831  TempSrc:   PainSc: 3       Patients Stated Pain Goal: 4 ([id number])  Complications: No apparent anesthesia complications

## 2016-10-15 NOTE — Op Note (Signed)
OPERATIVE REPORT- TOTAL HIP ARTHROPLASTY   PREOPERATIVE DIAGNOSIS: Osteoarthritis of the Right hip.   POSTOPERATIVE DIAGNOSIS: Osteoarthritis of the Right  hip.   PROCEDURE: Right total hip arthroplasty, anterior approach.   SURGEON: Gaynelle Arabian, MD   ASSISTANT: Arlee Muslim, PA-C  ANESTHESIA:  Spinal  ESTIMATED BLOOD LOSS:-200 ml   DRAINS: Hemovac x1.   COMPLICATIONS: None   CONDITION: PACU - hemodynamically stable.   BRIEF CLINICAL NOTE: Jacob Griffith is a 60 y.o. male who has advanced end-  stage arthritis of their Right  hip with progressively worsening pain and  dysfunction.The patient has failed nonoperative management and presents for  total hip arthroplasty.   PROCEDURE IN DETAIL: After successful administration of spinal  anesthetic, the traction boots for the [facility] bed were placed on both  feet and the patient was placed onto the [facility] bed, boots placed into the leg  holders. The Right hip was then isolated from the perineum with plastic  drapes and prepped and draped in the usual sterile fashion. ASIS and  greater trochanter were marked and a oblique incision was made, starting  at about 1 cm lateral and 2 cm distal to the ASIS and coursing towards  the anterior cortex of the femur. The skin was cut with a 10 blade  through subcutaneous tissue to the level of the fascia overlying the  tensor fascia lata muscle. The fascia was then incised in line with the  incision at the junction of the anterior third and posterior 2/3rd. The  muscle was teased off the fascia and then the interval between the TFL  and the rectus was developed. The Hohmann retractor was then placed at  the top of the femoral neck over the capsule. The vessels overlying the  capsule were cauterized and the fat on top of the capsule was removed.  A Hohmann retractor was then placed anterior underneath the rectus  femoris to give exposure to the entire anterior capsule. A T-shaped   capsulotomy was performed. The edges were tagged and the femoral head  was identified.       Osteophytes are removed off the superior acetabulum.  The femoral neck was then cut in situ with an oscillating saw. Traction  was then applied to the left lower extremity utilizing the [facility]  traction. The femoral head was then removed. Retractors were placed  around the acetabulum and then circumferential removal of the labrum was  performed. Osteophytes were also removed. Reaming starts at 49 mm to  medialize and  Increased in 2 mm increments to 53 mm. We reamed in  approximately 40 degrees of abduction, 20 degrees anteversion. A 54 mm  pinnacle acetabular shell was then impacted in anatomic position under  fluoroscopic guidance with excellent purchase. We did not need to place  any additional dome screws. A 36 mm neutral + 4 marathon liner was then  placed into the acetabular shell.       The femoral lift was then placed along the lateral aspect of the femur  just distal to the vastus ridge. The leg was  externally rotated and capsule  was stripped off the inferior aspect of the femoral neck down to the  level of the lesser trochanter, this was done with electrocautery. The femur was lifted after this was performed. The  leg was then placed in an extended and adducted position essentially delivering the femur. We also removed the capsule superiorly and the piriformis from the piriformis fossa to  gain excellent exposure of the  proximal femur. Rongeur was used to remove some cancellous bone to get  into the lateral portion of the proximal femur for placement of the  initial starter reamer. The starter broaches was placed  the starter broach  and was shown to go down the center of the canal. Broaching  with the  Corail system was then performed starting at size 8, coursing  Up to size 12. A size 12 had excellent torsional and rotational  and axial stability. The trial high offset neck was then  placed  with a 36 + 5 trial head. The hip was then reduced. We confirmed that  the stem was in the canal both on AP and lateral x-rays. It also has excellent sizing. The hip was reduced with outstanding stability through full extension and full external rotation.. AP pelvis was taken and the leg lengths were measured and found to be equal. Hip was then dislocated again and the femoral head and neck removed. The  femoral broach was removed. Size 12 Corail stem with a high offset  neck was then impacted into the femur following native anteversion. Has  excellent purchase in the canal. Excellent torsional and rotational and  axial stability. It is confirmed to be in the canal on AP and lateral  fluoroscopic views. The 36 + 5 ceramic head was placed and the hip  reduced with outstanding stability. Again AP pelvis was taken and it  confirmed that the leg lengths were equal. The wound was then copiously  irrigated with saline solution and the capsule reattached and repaired  with Ethibond suture. 30 ml of .25% Bupivicaine was  injected into the capsule and into the edge of the tensor fascia lata as well as subcutaneous tissue. The fascia overlying the tensor fascia lata was then closed with a running #1 V-Loc. Subcu was closed with interrupted 2-0 Vicryl and subcuticular running 4-0 Monocryl. Incision was cleaned  and dried. Steri-Strips and a bulky sterile dressing applied. Hemovac  drain was hooked to suction and then the patient was awakened and transported to  recovery in stable condition.        Please note that a surgical assistant was a medical necessity for this procedure to perform it in a safe and expeditious manner. Assistant was necessary to provide appropriate retraction of vital neurovascular structures and to prevent femoral fracture and allow for anatomic placement of the prosthesis.  Gaynelle Arabian, M.D.

## 2016-10-15 NOTE — Anesthesia Procedure Notes (Addendum)
Spinal  Patient location during procedure: OR Start time: 10/15/2016 9:39 AM End time: 10/15/2016 9:41 AM Staffing Resident/CRNA: Harle Stanford R Performed: resident/CRNA  Preanesthetic Checklist Completed: patient identified, site marked, surgical consent, pre-op evaluation, timeout performed, IV checked, risks and benefits discussed and monitors and equipment checked Spinal Block Patient position: sitting Prep: Betadine Patient monitoring: heart rate, cardiac monitor, continuous pulse ox and blood pressure Approach: midline Location: L3-4 Injection technique: single-shot Needle Needle type: Pencan  Needle gauge: 24 G Needle length: 10 cm Needle insertion depth: 7 cm Additional Notes Timeout performed. SAB kit date checked. SAB without difficulty

## 2016-10-15 NOTE — H&P (View-Only) (Signed)
Jacob Griffith DOB: 01-Mar-1956 Married / Language: English / Race: White Male Date of Admission:  10/15/2016 CC:  Right Hip Pain History of Present Illness  The patient is a 60 year old male who comes in  for a preoperative History and Physical. The patient is scheduled for a right total hip arthroplasty (anterior) to be performed by Dr. Dione Plover. [name], MD at [facility] on 10-15-2016. The patient is a 60 year old male who presented for follow up of their hip. The patient has been followed for their bilateral (left hip worse than right) hip pain and osteoarthritis. They are year(s) out from IA injection about 1 year ago. Symptoms reported include: pain, aching, stiffness and pain with weightbearing. The patient feels that they are doing poorly and report their pain level to be severe. Current treatment includes: activity modification. The following medication has been used for pain control: none. He has known bilateral hip arthritis but eh left hip was the most symptomatic and problematic of the two however he has just recently underwent surgical fixation of the left hip.  He has been limping for a long time which he feels has been afefecting his back. He was seen by Asencion Partridge, Dr. Rolena Infante' PA, recently for his back but it was felt that most of his pain was coming from his hip and was recommended to come back in to see Dr. Wynelle Link.[name] is at a stage now where the hip is bothering him at all times. Both hips were very symptomatic. The left has been replaced and the right continues to be a problem. He has documented advanced arthritis of the hip. He has had multiple injections without benefit. Hips are having very negative factor in his life. Not only they are hurting him, but he has also lost lot of motion and lot of function. He is ready to proceed with surgery on the other side, the right hip at this time. They have been treated conservatively in the past for the above stated problem and  despite conservative measures, they continue to have progressive pain and severe functional limitations and dysfunction. They have failed non-operative management including home exercise, medications, and injections. It is felt that they would benefit from undergoing total joint replacement. Risks and benefits of the procedure have been discussed with the patient and they elect to proceed with surgery. There are no active contraindications to surgery such as ongoing infection or rapidly progressive neurological disease.    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 Joint Pain  Family History Drug / Alcohol Addiction  mother and grandfather fathers side Hypertension  father Osteoarthritis  grandmother mothers side Rheumatoid Arthritis  father and grandmother mothers side Cancer  Father. father  Social History  Alcohol use  current drinker; drinks beer; 8-14 per week Living situation  live with spouse Illicit drug use  no Tobacco use  Never smoker. 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 Garlic (Oral) Active. Vitamin D (1000UNIT Tablet, Oral) Active. Atorvastatin Calcium (10MG  Tablet, Oral) Active. Xanax (0.5MG  Tablet, Oral)  Active. Glucosamine Chondr 500 Complex (Oral) Active.  Past Surgical History Arthroscopy of Knee  left Carpal Tunnel Repair  left Vasectomy    Review of Systems General Not Present- Chills, Fatigue, Fever, Memory Loss, Night Sweats, Weight Gain and Weight Loss. Skin Not Present- Eczema, Hives, Itching, Lesions and Rash. HEENT Present- Hearing Loss and Tinnitus. Not Present- Dentures, Double Vision, Headache and Visual Loss. Respiratory Not Present- Allergies, Chronic Cough, Coughing up blood, Shortness of breath at rest and Shortness of breath with exertion. Cardiovascular Not Present- Chest Pain, Difficulty Breathing Lying Down, Murmur, Palpitations, Racing/skipping heartbeats and Swelling. Gastrointestinal Not Present- Abdominal Pain, Bloody Stool, Constipation, Diarrhea, Difficulty Swallowing, Heartburn, Jaundice, Loss of appetitie, Nausea and Vomiting. Male Genitourinary Not Present- Blood in Urine, Discharge, Flank Pain, Incontinence, Painful Urination, Urgency, Urinary frequency, Urinary Retention, Urinating at Night and Weak urinary stream. Musculoskeletal Present- Back Pain, Joint Pain and Morning Stiffness. Not Present- Joint Swelling, Muscle Pain, Muscle Weakness and Spasms. Neurological Not Present- Blackout spells, Difficulty with balance, Dizziness, Paralysis, Tremor and Weakness. Psychiatric Not Present- Insomnia.  Vitals  Weight: 190 lb Height: 66in Weight was reported by patient. Height was reported by patient. Body Surface Area: 1.96 m Body Mass Index: 30.67 kg/m  Pulse: 80 (Regular)  BP: 128/78 (Sitting, Right Arm, Standard)       Physical Exam General Mental Status -Alert, cooperative and good historian. General Appearance-pleasant, Not in acute distress. Orientation-Oriented X3. Build & Nutrition-Well nourished and Well developed.  Head and Neck Head-normocephalic, atraumatic . Neck Global Assessment - supple, no bruit  auscultated on the right, no bruit auscultated on the left.  Eye Vision-Wears corrective lenses. Pupil - Bilateral-Regular and Round. Motion - Bilateral-EOMI.  Chest and Lung Exam Auscultation Breath sounds - clear at anterior chest wall and clear at posterior chest wall. Adventitious sounds - No Adventitious sounds.  Cardiovascular Auscultation Rhythm - Regular rate and rhythm. Heart Sounds - S1 WNL and S2 WNL. Murmurs & Other Heart Sounds - Auscultation of the heart reveals - No Murmurs.  Abdomen Palpation/Percussion Tenderness - Abdomen is non-tender to palpation. Rigidity (guarding) - Abdomen is soft. Auscultation Auscultation of the abdomen reveals - Bowel sounds normal.  Male Genitourinary Note: Not done, not pertinent to present illness   Musculoskeletal Note: He is alert and oriented, no apparent distress. The right hip flex to about 100, minimal internal rotation, 20 external rotation, 20 abduction. Knee exam is normal bilaterally. Pulse, sensation and motor intact. He has a significantly antalgic gait pattern.  RADIOGRAPHS AP pelvis and lateral of hip show severe end stage arthritis of right hip, bone on bone in both with subchondral cystic formation and osteophyte formation.     Assessment & Plan  Aftercare following left hip joint replacement surgery (Z47.1, [id number]) Primary osteoarthritis of right hip (M16.11)  Note:Surgical Plans: Right Total Hip Replacement - Anterior Approach  Disposition: Home  PCP: Dr. Lavone Orn  IV TXA  Anesthesia Issues: None  Signed electronically by Ok Edwards, III PA-C

## 2016-10-16 ENCOUNTER — Encounter (HOSPITAL_COMMUNITY): Payer: Self-pay | Admitting: Orthopedic Surgery

## 2016-10-16 LAB — BASIC METABOLIC PANEL
ANION GAP: 6 (ref 5–15)
BUN: 14 mg/dL (ref 6–20)
CO2: 23 mmol/L (ref 22–32)
Calcium: 8.6 mg/dL — ABNORMAL LOW (ref 8.9–10.3)
Chloride: 107 mmol/L (ref 101–111)
Creatinine, Ser: 0.92 mg/dL (ref 0.61–1.24)
GFR calc Af Amer: >60 mL/min (ref >60)
Glucose, Bld: 153 mg/dL — ABNORMAL HIGH (ref 65–99)
POTASSIUM: 4.2 mmol/L (ref 3.5–5.1)
SODIUM: 136 mmol/L (ref 135–145)

## 2016-10-16 LAB — CBC
HCT: 41.3 % (ref 39.0–52.0)
Hemoglobin: 14.1 g/dL (ref 13.0–17.0)
MCH: 32.9 pg (ref 26.0–34.0)
MCHC: 34.1 g/dL (ref 30.0–36.0)
MCV: 96.5 fL (ref 78.0–100.0)
Platelets: 274 10*3/uL (ref 150–400)
RBC: 4.28 MIL/uL (ref 4.22–5.81)
RDW: 14.1 % (ref 11.5–15.5)
WBC: 14.5 10*3/uL — AB (ref 4.0–10.5)

## 2016-10-16 MED ORDER — METHOCARBAMOL 500 MG PO TABS: 500.0000 mg | ORAL_TABLET | Freq: Four times a day (QID) | ORAL | 0 refills | Status: DC | PRN

## 2016-10-16 MED ORDER — OXYCODONE HCL 5 MG PO TABS: 5.0000 mg | ORAL_TABLET | ORAL | 0 refills | Status: DC | PRN

## 2016-10-16 MED ORDER — TRAMADOL HCL 50 MG PO TABS: 50.0000 mg | ORAL_TABLET | Freq: Four times a day (QID) | ORAL | 1 refills | Status: DC | PRN

## 2016-10-16 MED ORDER — RIVAROXABAN 10 MG PO TABS: 10.0000 mg | ORAL_TABLET | Freq: Every day | ORAL | 0 refills | Status: DC

## 2016-10-16 NOTE — Progress Notes (Signed)
OT Cancellation Note  Patient Details Name: Jacob Griffith MRN: [id number] DOB: 02-03-1956   Cancelled Treatment:    Reason Eval/Treat Not Completed: OT screened, no needs identified, will sign off. Pt states he is familiar with all OT techniques from previous surgery this year. Wife also states she is familiar with how to assist pt and feels comfortable with the self care tasks. He has all DME. No acute OT needs at this time. Will sign off.  Pauline Aus [place] 10/16/2016, 9:47 AM

## 2016-10-16 NOTE — Progress Notes (Signed)
Physical Therapy Treatment Patient Details Name: Jacob Griffith MRN: [id number] DOB: 09-09-1956 Today's Date: 10/16/2016    History of Present Illness Pt s/p R THR and with hx of L THR 6/17    PT Comments    Pt progressing well with mobility and eager for dc home.  Reviewed therex, stairs and car transfers with pt and spouse.  Follow Up Recommendations  Home health PT     Equipment Recommendations  None recommended by PT    Recommendations for Other Services OT consult     Precautions / Restrictions Precautions Precautions: Fall Restrictions Weight Bearing Restrictions: No Other Position/Activity Restrictions: WBAT    Mobility  Bed Mobility Overal bed mobility: Needs Assistance Bed Mobility: Supine to Sit     Supine to sit: Supervision        Transfers Overall transfer level: Needs assistance Equipment used: Rolling walker (2 wheeled) Transfers: Sit to/from Stand Sit to Stand: Supervision         General transfer comment: min cues for LE management and use of UEs to self assist  Ambulation/Gait Ambulation/Gait assistance: Supervision Ambulation Distance (Feet): 400 Feet Assistive device: Rolling walker (2 wheeled) Gait Pattern/deviations: Step-to pattern;Step-through pattern;Shuffle Gait velocity: decr Gait velocity interpretation: Below normal speed for age/gender General Gait Details: min cues for posture, position from RW and initial sequence   Stairs Stairs: Yes Stairs assistance: Min guard Stair Management: Two rails;Step to pattern;Forwards Number of Stairs: 5 General stair comments: min cues for sequence and foot placement  Wheelchair Mobility    Modified Rankin (Stroke Patients Only)       Balance                                    Cognition Arousal/Alertness: Awake/alert Behavior During Therapy: WFL for tasks assessed/performed Overall Cognitive Status: Within Functional Limits for tasks assessed                      Exercises Total Joint Exercises Ankle Circles/Pumps: AROM;Both;15 reps;Supine Quad Sets: AROM;Both;10 reps;Supine Heel Slides: AAROM;Right;20 reps;Supine Hip ABduction/ADduction: AAROM;Right;15 reps;Supine Long Arc Quad: AROM;10 reps;Seated;Right    General Comments        Pertinent Vitals/Pain Pain Assessment: 0-10 Pain Score: 4  Pain Location: R hip Pain Descriptors / Indicators: Aching;Sore Pain Intervention(s): Limited activity within patient's tolerance;Monitored during session;Premedicated before session;Ice applied    Home Living                      Prior Function            PT Goals (current goals can now be found in the care plan section) Acute Rehab PT Goals Patient Stated Goal: Regain IND and get back to work PT Goal Formulation: With patient Time For Goal Achievement: 10/17/16 Potential to Achieve Goals: Good Progress towards PT goals: Progressing toward goals    Frequency    7X/week      PT Plan Current plan remains appropriate    Co-evaluation             End of Session Equipment Utilized During Treatment: Gait belt Activity Tolerance: Patient tolerated treatment well Patient left: in chair;with call bell/phone within reach;with family/visitor present     Time: 0927-1005 PT Time Calculation (min) (ACUTE ONLY): 38 min  Charges:  $Gait Training: 8-22 mins $Therapeutic Exercise: 8-22 mins $Therapeutic Activity: 8-22 mins  G Codes:      Emalie Mcwethy 11-01-16, 12:19 PM

## 2016-10-16 NOTE — Discharge Summary (Signed)
Physician Discharge Summary   Patient ID: Jacob Griffith MRN: [id number] DOB/AGE: 1956-02-04 60 y.o.  Admit date: 10/15/2016 Discharge date: 10-16-2016  Primary Diagnosis:  Osteoarthritis of the Right hip.   Admission Diagnoses:  Past Medical History:  Diagnosis Date  . Arthritis   . GERD (gastroesophageal reflux disease)    30 years ago   . History of hiatal hernia   . History of urinary tract infection    35 years ago   . Hyperlipidemia   . Tinnitus   . Wears glasses    Discharge Diagnoses:   Active Problems:   OA (osteoarthritis) of hip  Estimated body mass index is 31.31 kg/m as calculated from the following:   Height as of this encounter: _0  (1.676 m).   Weight as of this encounter: 88 kg (194 lb).  Procedure(s) (LRB): RIGHT TOTAL HIP ARTHROPLASTY ANTERIOR APPROACH (Right)   Consults: None  HPI: Jacob Griffith is a 60 y.o. male who has advanced end-  stage arthritis of their Right  hip with progressively worsening pain and  dysfunction.The patient has failed nonoperative management and presents for  total hip arthroplasty.  Laboratory Data: Admission on 10/15/2016  Component Date Value Ref Range Status  . WBC 10/16/2016 14.5* 4.0 - 10.5 K/uL Final  . RBC 10/16/2016 4.28  4.22 - 5.81 MIL/uL Final  . Hemoglobin 10/16/2016 14.1  13.0 - 17.0 g/dL Final  . HCT 10/16/2016 41.3  39.0 - 52.0 % Final  . MCV 10/16/2016 96.5  78.0 - 100.0 fL Final  . MCH 10/16/2016 32.9  26.0 - 34.0 pg Final  . MCHC 10/16/2016 34.1  30.0 - 36.0 g/dL Final  . RDW 10/16/2016 14.1  11.5 - 15.5 % Final  . Platelets 10/16/2016 274  150 - 400 K/uL Final  . Sodium 10/16/2016 136  135 - 145 mmol/L Final  . Potassium 10/16/2016 4.2  3.5 - 5.1 mmol/L Final  . Chloride 10/16/2016 107  101 - 111 mmol/L Final  . CO2 10/16/2016 23  22 - 32 mmol/L Final  . Glucose, Bld 10/16/2016 153* 65 - 99 mg/dL Final  . BUN 10/16/2016 14  6 - 20 mg/dL Final  . Creatinine, Ser 10/16/2016 0.92  0.61 - 1.24  mg/dL Final  . Calcium 10/16/2016 8.6* 8.9 - 10.3 mg/dL Final  . GFR calc non Af Amer 10/16/2016 >60  >60 mL/min Final  . GFR calc Af Amer 10/16/2016 >60  >60 mL/min Final   Comment: (NOTE) The eGFR has been calculated using the CKD EPI equation. This calculation has not been validated in all clinical situations. eGFR's persistently <60 mL/min signify possible Chronic Kidney Disease.   Georgiann Hahn gap 10/16/2016 6  5 - 15 Final  Hospital Outpatient Visit on 10/08/2016  Component Date Value Ref Range Status  . aPTT 10/08/2016 35  24 - 36 seconds Final  . WBC 10/08/2016 9.1  4.0 - 10.5 K/uL Final  . RBC 10/08/2016 4.73  4.22 - 5.81 MIL/uL Final  . Hemoglobin 10/08/2016 15.7  13.0 - 17.0 g/dL Final  . HCT 10/08/2016 45.8  39.0 - 52.0 % Final  . MCV 10/08/2016 96.8  78.0 - 100.0 fL Final  . MCH 10/08/2016 33.2  26.0 - 34.0 pg Final  . MCHC 10/08/2016 34.3  30.0 - 36.0 g/dL Final  . RDW 10/08/2016 13.9  11.5 - 15.5 % Final  . Platelets 10/08/2016 299  150 - 400 K/uL Final  . Sodium 10/08/2016 138  135 - 145 mmol/L Final  .  Potassium 10/08/2016 4.7  3.5 - 5.1 mmol/L Final  . Chloride 10/08/2016 102  101 - 111 mmol/L Final  . CO2 10/08/2016 28  22 - 32 mmol/L Final  . Glucose, Bld 10/08/2016 98  65 - 99 mg/dL Final  . BUN 10/08/2016 23* 6 - 20 mg/dL Final  . Creatinine, Ser 10/08/2016 1.18  0.61 - 1.24 mg/dL Final  . Calcium 10/08/2016 9.9  8.9 - 10.3 mg/dL Final  . Total Protein 10/08/2016 8.0  6.5 - 8.1 g/dL Final  . Albumin 10/08/2016 4.9  3.5 - 5.0 g/dL Final  . AST 10/08/2016 31  15 - 41 U/L Final  . ALT 10/08/2016 25  17 - 63 U/L Final  . Alkaline Phosphatase 10/08/2016 85  38 - 126 U/L Final  . Total Bilirubin 10/08/2016 1.2  0.3 - 1.2 mg/dL Final  . GFR calc non Af Amer 10/08/2016 >60  >60 mL/min Final  . GFR calc Af Amer 10/08/2016 >60  >60 mL/min Final   Comment: (NOTE) The eGFR has been calculated using the CKD EPI equation. This calculation has not been validated in all  clinical situations. eGFR's persistently <60 mL/min signify possible Chronic Kidney Disease.   . Anion gap 10/08/2016 8  5 - 15 Final  . Prothrombin Time 10/08/2016 12.5  11.4 - 15.2 seconds Final  . INR 10/08/2016 0.94   Final  . ABO/RH(D) 10/15/2016 O POS   Final  . Antibody Screen 10/15/2016 NEG   Final  . Sample Expiration 10/15/2016 10/18/2016   Final  . Extend sample reason 10/15/2016 NO TRANSFUSIONS OR PREGNANCY IN THE PAST 3 MONTHS   Final  . Color, Urine 10/08/2016 YELLOW  YELLOW Final  . APPearance 10/08/2016 CLEAR  CLEAR Final  . Specific Gravity, Urine 10/08/2016 1.016  1.005 - 1.030 Final  . pH 10/08/2016 6.5  5.0 - 8.0 Final  . Glucose, UA 10/08/2016 NEGATIVE  NEGATIVE mg/dL Final  . Hgb urine dipstick 10/08/2016 NEGATIVE  NEGATIVE Final  . Bilirubin Urine 10/08/2016 NEGATIVE  NEGATIVE Final  . Ketones, ur 10/08/2016 NEGATIVE  NEGATIVE mg/dL Final  . Protein, ur 10/08/2016 NEGATIVE  NEGATIVE mg/dL Final  . Nitrite 10/08/2016 NEGATIVE  NEGATIVE Final  . Leukocytes, UA 10/08/2016 NEGATIVE  NEGATIVE Final  . MRSA, PCR 10/08/2016 NEGATIVE  NEGATIVE Final  . Staphylococcus aureus 10/08/2016 NEGATIVE  NEGATIVE Final   Comment:        The Xpert SA Assay (FDA approved for NASAL specimens in patients over 38 years of age), is one component of a comprehensive surveillance program.  Test performance has been validated by [facility] for patients greater than or equal to 80 year old. It is not intended to diagnose infection nor to guide or monitor treatment.      X-Rays:Dg Pelvis Portable  Result Date: 10/15/2016 CLINICAL DATA:  Postop right hip replaced EXAM: PORTABLE PELVIS 1-2 VIEWS COMPARISON:  Pelvis film of 04/23/2016 FINDINGS: The acetabular and femoral components of the right total hip replacement are in good position. No complicating features are seen. The left total hip replacement components are unchanged in position. The pelvic rami are intact. IMPRESSION:  Right total hip replacement components in good position. No change of left total hip replacement. Electronically Signed   By: Ivar Drape M.D.   On: 10/15/2016 12:18    EKG:No orders found for this or any previous visit.   Hospital Course: Patient was admitted to [facility] and taken to the OR and underwent the above state  procedure without complications.  Patient tolerated the procedure well and was later transferred to the recovery room and then to the orthopaedic floor for postoperative care.  They were given PO and IV analgesics for pain control following their surgery.  They were given 24 hours of postoperative antibiotics of  Anti-infectives    Start     Dose/Rate Route Frequency Ordered Stop   10/15/16 2200  vancomycin (VANCOCIN) IVPB 1000 mg/200 mL premix     1,000 mg 200 mL/hr over 60 Minutes Intravenous Every 12 hours 10/15/16 1312 10/15/16 2246   10/15/16 0735  vancomycin (VANCOCIN) IVPB 1000 mg/200 mL premix     1,000 mg 200 mL/hr over 60 Minutes Intravenous On call to O.R. 10/15/16 0947 10/15/16 0945     and started on DVT prophylaxis in the form of Xarelto.   PT and OT were ordered for total hip protocol.  The patient was allowed to be WBAT with therapy. Discharge planning was consulted to help with postop disposition and equipment needs.  Patient had a good night on the evening of surgery.  They started to get up OOB with therapy on day one.  Hemovac drain was pulled without difficulty.  Dressing was checked and was clean and dry.   Patient was seen in rounds on POD 1 by Dr. Wynelle Link  and was ready to go home.   Discharge home with home health Diet - Cardiac diet Follow up - in 2 weeks Activity - WBAT Disposition - Home Condition Upon Discharge - improving D/C Meds - See DC Summary DVT Prophylaxis - Xarelto  Discharge Instructions    Call MD / Call 911    Complete by:  As directed    If you experience chest pain or shortness of breath, CALL 911 and be transported  to the hospital emergency room.  If you develope a fever above 101 F, pus (white drainage) or increased drainage or redness at the wound, or calf pain, call your surgeon's office.   Change dressing    Complete by:  As directed    You may change your dressing dressing daily with sterile 4 x 4 inch gauze dressing and paper tape.  Do not submerge the incision under water.   Constipation Prevention    Complete by:  As directed    Drink plenty of fluids.  Prune juice may be helpful.  You may use a stool softener, such as Colace (over the counter) 100 mg twice a day.  Use MiraLax (over the counter) for constipation as needed.   Diet - low sodium heart healthy    Complete by:  As directed    Discharge instructions    Complete by:  As directed    Pick up stool softner and laxative for home use following surgery while on pain medications. Do not submerge incision under water. Please use good hand washing techniques while changing dressing each day. May shower starting three days after surgery. Please use a clean towel to pat the incision dry following showers. Continue to use ice for pain and swelling after surgery. Do not use any lotions or creams on the incision until instructed by your surgeon.   Postoperative Constipation Protocol  Constipation - defined medically as fewer than three stools per week and severe constipation as less than one stool per week.  One of the most common issues patients have following surgery is constipation.  Even if you have a regular bowel pattern at home, your normal regimen is likely to  be disrupted due to multiple reasons following surgery.  Combination of anesthesia, postoperative narcotics, change in appetite and fluid intake all can affect your bowels.  In order to avoid complications following surgery, here are some recommendations in order to help you during your recovery period.  Colace (docusate) - Pick up an over-the-counter form of Colace or another stool  softener and take twice a day as long as you are requiring postoperative pain medications.  Take with a full glass of water daily.  If you experience loose stools or diarrhea, hold the colace until you stool forms back up.  If your symptoms do not get better within 1 week or if they get worse, check with your doctor.  Dulcolax (bisacodyl) - Pick up over-the-counter and take as directed by the product packaging as needed to assist with the movement of your bowels.  Take with a full glass of water.  Use this product as needed if not relieved by Colace only.   MiraLax (polyethylene glycol) - Pick up over-the-counter to have on hand.  MiraLax is a solution that will increase the amount of water in your bowels to assist with bowel movements.  Take as directed and can mix with a glass of water, juice, soda, coffee, or tea.  Take if you go more than two days without a movement. Do not use MiraLax more than once per day. Call your doctor if you are still constipated or irregular after using this medication for 7 days in a row.  If you continue to have problems with postoperative constipation, please contact the office for further assistance and recommendations.  If you experience "the worst abdominal pain ever" or develop nausea or vomiting, please contact the office immediatly for further recommendations for treatment.   Take Xarelto for two and a half more weeks, then discontinue Xarelto. Once the patient has completed the Xarelto, they may resume the 81 mg Aspirin.   Do not sit on low chairs, stoools or toilet seats, as it may be difficult to get up from low surfaces    Complete by:  As directed    Driving restrictions    Complete by:  As directed    No driving until released by the physician.   Increase activity slowly as tolerated    Complete by:  As directed    Lifting restrictions    Complete by:  As directed    No lifting until released by the physician.   Patient may shower    Complete by:  As  directed    You may shower without a dressing once there is no drainage.  Do not wash over the wound.  If drainage remains, do not shower until drainage stops.   TED hose    Complete by:  As directed    Use stockings (TED hose) for 3 weeks on both leg(s).  You may remove them at night for sleeping.   Weight bearing as tolerated    Complete by:  As directed    Laterality:  right   Extremity:  Lower       Medication List    STOP taking these medications   aspirin 81 MG tablet   cholecalciferol 1000 units tablet Commonly known as:  VITAMIN D   Garlic 240 MG Tabs   GLUCOSAMINE CHONDR COMPLEX PO     TAKE these medications   ALPRAZolam 0.5 MG tablet Commonly known as:  XANAX Take 0.5 mg by mouth at bedtime as needed for sleep.  atorvastatin 20 MG tablet Commonly known as:  LIPITOR Take 20 mg by mouth daily.   methocarbamol 500 MG tablet Commonly known as:  ROBAXIN Take 1 tablet (500 mg total) by mouth every 6 (six) hours as needed for muscle spasms.   oxyCODONE 5 MG immediate release tablet Commonly known as:  Oxy IR/ROXICODONE Take 1-2 tablets (5-10 mg total) by mouth every 3 (three) hours as needed for moderate pain or severe pain.   rivaroxaban 10 MG Tabs tablet Commonly known as:  XARELTO Take 1 tablet (10 mg total) by mouth daily with breakfast. Take Xarelto for two and a half more weeks, then discontinue Xarelto. Once the patient has completed the Xarelto, they may resume the 81 mg Aspirin.   traMADol 50 MG tablet Commonly known as:  ULTRAM Take 1-2 tablets (50-100 mg total) by mouth every 6 (six) hours as needed (mild pain).      Follow-up Information    Gearlean Alf, MD. Schedule an appointment as soon as possible for a visit on 10/28/2016.   Specialty:  Orthopedic Surgery Contact information: [address] [place] [address] [phone]           Signed: Arlee Muslim, PA-C Orthopaedic Surgery 10/16/2016, 8:12  AM

## 2016-10-16 NOTE — Care Management Note (Signed)
Case Management Note  Patient Details  Name: Jacob Griffith MRN: [id number] Date of Birth: 01/15/1956  Subjective/Objective:                  Osteoarthritis of the Righthip. Action/Plan: Discharge planning Expected Discharge Date:  10/16/16               Expected Discharge Plan:  [name]  In-House Referral:     Discharge planning Services  CM Consult  Post Acute Care Choice:  Home Health Choice offered to:  Patient  DME Arranged:  N/A DME Agency:  NA  HH Arranged:  PT [place] Agency:  [facility] (formerly [employer])  Status of Service:  Completed, signed off  If discussed at [employer], dates discussed:    Additional Comments: CM met with pt in room to offer choice of home health agency.  Pt chooses [facility] to render HHPT.  Referral given to [facility] rep, Tim.  Pt has all needed DME at home from [facility].  NO other CM needs were communicated. [name], RN 10/16/2016, 10:44 AM

## 2016-10-16 NOTE — Discharge Instructions (Addendum)
° °Dr. Frank [name] °Total Joint Specialist °[facility] °[address] °[place], [place] [address] °[phone] ° °ANTERIOR APPROACH TOTAL HIP REPLACEMENT POSTOPERATIVE DIRECTIONS ° ° °Hip Rehabilitation, Guidelines Following Surgery  °The results of a hip operation are greatly improved after range of motion and muscle strengthening exercises. Follow all safety measures which are given to protect your hip. If any of these exercises cause increased pain or swelling in your joint, decrease the amount until you are comfortable again. Then slowly increase the exercises. Call your caregiver if you have problems or questions.  ° °HOME CARE INSTRUCTIONS  °Remove items at home which could result in a fall. This includes throw rugs or furniture in walking pathways.  °· ICE to the affected hip every three hours for 30 minutes at a time and then as needed for pain and swelling.  Continue to use ice on the hip for pain and swelling from surgery. You may notice swelling that will progress down to the foot and ankle.  This is normal after surgery.  Elevate the leg when you are not up walking on it.   °· Continue to use the breathing machine which will help keep your temperature down.  It is common for your temperature to cycle up and down following surgery, especially at night when you are not up moving around and exerting yourself.  The breathing machine keeps your lungs expanded and your temperature down. ° ° °DIET °You may resume your previous home diet once your are discharged from the hospital. ° °DRESSING / WOUND CARE / SHOWERING °You may shower 3 days after surgery, but keep the wounds dry during showering.  You may use an occlusive plastic wrap (Press'n Seal for example), NO SOAKING/SUBMERGING IN THE BATHTUB.  If the bandage gets wet, change with a clean dry gauze.  If the incision gets wet, pat the wound dry with a clean towel. °You may start showering once you are discharged home but do not  submerge the incision under water. Just pat the incision dry and apply a dry gauze dressing on daily. °Change the surgical dressing daily and reapply a dry dressing each time. ° °ACTIVITY °Walk with your walker as instructed. °Use walker as long as suggested by your caregivers. °Avoid periods of inactivity such as sitting longer than an hour when not asleep. This helps prevent blood clots.  °You may resume a sexual relationship in one month or when given the OK by your doctor.  °You may return to work once you are cleared by your doctor.  °Do not drive a car for 6 weeks or until released by you surgeon.  °Do not drive while taking narcotics. ° °WEIGHT BEARING °Weight bearing as tolerated with assist device (walker, cane, etc) as directed, use it as long as suggested by your surgeon or therapist, typically at least 4-6 weeks. ° °POSTOPERATIVE CONSTIPATION PROTOCOL °Constipation - defined medically as fewer than three stools per week and severe constipation as less than one stool per week. ° °One of the most common issues patients have following surgery is constipation.  Even if you have a regular bowel pattern at home, your normal regimen is likely to be disrupted due to multiple reasons following surgery.  Combination of anesthesia, postoperative narcotics, change in appetite and fluid intake all can affect your bowels.  In order to avoid complications following surgery, here are some recommendations in order to help you during your recovery period. ° °Colace (docusate) - Pick up an over-the-counter   form of Colace or another stool softener and take twice a day as long as you are requiring postoperative pain medications.  Take with a full glass of water daily.  If you experience loose stools or diarrhea, hold the colace until you stool forms back up.  If your symptoms do not get better within 1 week or if they get worse, check with your doctor. ° °Dulcolax (bisacodyl) - Pick up over-the-counter and take as directed  by the product packaging as needed to assist with the movement of your bowels.  Take with a full glass of water.  Use this product as needed if not relieved by Colace only.  ° °MiraLax (polyethylene glycol) - Pick up over-the-counter to have on hand.  MiraLax is a solution that will increase the amount of water in your bowels to assist with bowel movements.  Take as directed and can mix with a glass of water, juice, soda, coffee, or tea.  Take if you go more than two days without a movement. °Do not use MiraLax more than once per day. Call your doctor if you are still constipated or irregular after using this medication for 7 days in a row. ° °If you continue to have problems with postoperative constipation, please contact the office for further assistance and recommendations.  If you experience "the worst abdominal pain ever" or develop nausea or vomiting, please contact the office immediatly for further recommendations for treatment. ° °ITCHING ° If you experience itching with your medications, try taking only a single pain pill, or even half a pain pill at a time.  You can also use Benadryl over the counter for itching or also to help with sleep.  ° °TED HOSE STOCKINGS °Wear the elastic stockings on both legs for three weeks following surgery during the day but you may remove then at night for sleeping. ° °MEDICATIONS °See your medication summary on the “After Visit Summary” that the nursing staff will review with you prior to discharge.  You may have some home medications which will be placed on hold until you complete the course of blood thinner medication.  It is important for you to complete the blood thinner medication as prescribed by your surgeon.  Continue your approved medications as instructed at time of discharge. ° °PRECAUTIONS °If you experience chest pain or shortness of breath - call 911 immediately for transfer to the hospital emergency department.  °If you develop a fever greater that 101 F,  purulent drainage from wound, increased redness or drainage from wound, foul odor from the wound/dressing, or calf pain - CONTACT YOUR SURGEON.   °                                                °FOLLOW-UP APPOINTMENTS °Make sure you keep all of your appointments after your operation with your surgeon and caregivers. You should call the office at the above phone number and make an appointment for approximately two weeks after the date of your surgery or on the date instructed by your surgeon outlined in the "After Visit Summary". ° °RANGE OF MOTION AND STRENGTHENING EXERCISES  °These exercises are designed to help you keep full movement of your hip joint. Follow your caregiver's or physical therapist's instructions. Perform all exercises about fifteen times, three times per day or as directed. Exercise both hips, even if you   have had only one joint replacement. These exercises can be done on a training (exercise) mat, on the floor, on a table or on a bed. Use whatever works the best and is most comfortable for you. Use music or television while you are exercising so that the exercises are a pleasant break in your day. This will make your life better with the exercises acting as a break in routine you can look forward to.  Lying on your back, slowly slide your foot toward your buttocks, raising your knee up off the floor. Then slowly slide your foot back down until your leg is straight again.  Lying on your back spread your legs as far apart as you can without causing discomfort.  Lying on your side, raise your upper leg and foot straight up from the floor as far as is comfortable. Slowly lower the leg and repeat.  Lying on your back, tighten up the muscle in the front of your thigh (quadriceps muscles). You can do this by keeping your leg straight and trying to raise your heel off the floor. This helps strengthen the largest muscle supporting your knee.  Lying on your back, tighten up the muscles of your  buttocks both with the legs straight and with the knee bent at a comfortable angle while keeping your heel on the floor.   IF YOU ARE TRANSFERRED TO A SKILLED REHAB FACILITY If the patient is transferred to a skilled rehab facility following release from the hospital, a list of the current medications will be sent to the facility for the patient to continue.  When discharged from the skilled rehab facility, please have the facility set up the patient's Cecilia prior to being released. Also, the skilled facility will be responsible for providing the patient with their medications at time of release from the facility to include their pain medication, the muscle relaxants, and their blood thinner medication. If the patient is still at the rehab facility at time of the two week follow up appointment, the skilled rehab facility will also need to assist the patient in arranging follow up appointment in our office and any transportation needs.  MAKE SURE YOU:  Understand these instructions.  Get help right away if you are not doing well or get worse.    Pick up stool softner and laxative for home use following surgery while on pain medications. Do not submerge incision under water. Please use good hand washing techniques while changing dressing each day. May shower starting three days after surgery. Please use a clean towel to pat the incision dry following showers. Continue to use ice for pain and swelling after surgery. Do not use any lotions or creams on the incision until instructed by your surgeon.  Take Xarelto for two and a half more weeks, then discontinue Xarelto. Once the patient has completed the Xarelto, they may resume the 81 mg Aspirin.    Information on my medicine - XARELTO (Rivaroxaban)  This medication education was reviewed with me or my healthcare representative as part of my discharge preparation.   Why was Xarelto prescribed for you? Xarelto was  prescribed for you to reduce the risk of blood clots forming after orthopedic surgery. The medical term for these abnormal blood clots is venous thromboembolism (VTE).  What do you need to know about xarelto ? Take your Xarelto ONCE DAILY at the same time every day. You may take it either with or without food.  If you have difficulty  swallowing the tablet whole, you may crush it and mix in applesauce just prior to taking your dose.  Take Xarelto exactly as prescribed by your doctor and DO NOT stop taking Xarelto without talking to the doctor who prescribed the medication.  Stopping without other VTE prevention medication to take the place of Xarelto may increase your risk of developing a clot.  After discharge, you should have regular check-up appointments with your healthcare provider that is prescribing your Xarelto.    What do you do if you miss a dose? If you miss a dose, take it as soon as you remember on the same day then continue your regularly scheduled once daily regimen the next day. Do not take two doses of Xarelto on the same day.   Important Safety Information A possible side effect of Xarelto is bleeding. You should call your healthcare provider right away if you experience any of the following: ? Bleeding from an injury or your nose that does not stop. ? Unusual colored urine (red or dark brown) or unusual colored stools (red or black). ? Unusual bruising for unknown reasons. ? A serious fall or if you hit your head (even if there is no bleeding).  Some medicines may interact with Xarelto and might increase your risk of bleeding while on Xarelto. To help avoid this, consult your healthcare provider or pharmacist prior to using any new prescription or non-prescription medications, including herbals, vitamins, non-steroidal anti-inflammatory drugs (NSAIDs) and supplements.  This website has more information on Xarelto: [email or web address]

## 2016-10-16 NOTE — Progress Notes (Signed)
   Subjective: 1 Day Post-Op Procedure(s) (LRB): RIGHT TOTAL HIP ARTHROPLASTY ANTERIOR APPROACH (Right) Patient reports pain as mild.   Patient seen in rounds for Dr. Wynelle Link. Patient is well, but has had some minor complaints of pain in the hip, requiring pain medications We will start therapy today.  If they do well with therapy and meets all goals, then will allow home later this afternoon following therapy. Plan is to go Home after hospital stay.  Objective: Vital signs in last 24 hours: Temp:  [97.6 F (36.4 C)-98.7 F (37.1 C)] 98 F (36.7 C) (11/30 0602) Pulse Rate:  [50-74] 68 (11/30 0602) Resp:  [10-19] 16 (11/30 0602) BP: (102-145)/(65-95) 110/71 (11/30 0602) SpO2:  [94 %-100 %] 94 % (11/30 0602)  Intake/Output from previous day:  Intake/Output Summary (Last 24 hours) at 10/16/16 0806 Last data filed at 10/16/16 0756  Gross per 24 hour  Intake          3728.33 ml  Output             4200 ml  Net          -471.67 ml    Intake/Output this shift: Total I/O In: 240 [P.O.:240] Out: -   Labs:  Recent Labs  10/16/16 0434  HGB 14.1    Recent Labs  10/16/16 0434  WBC 14.5*  RBC 4.28  HCT 41.3  PLT 274    Recent Labs  10/16/16 0434  NA 136  K 4.2  CL 107  CO2 23  BUN 14  CREATININE 0.92  GLUCOSE 153*  CALCIUM 8.6*   No results for input(s): LABPT, INR in the last 72 hours.  EXAM General - Patient is Alert, Appropriate and Oriented Extremity - Neurovascular intact Sensation intact distally Dorsiflexion/Plantar flexion intact Dressing - dressing C/D/I Motor Function - intact, moving foot and toes well on exam.  Hemovac pulled without difficulty.  Past Medical History:  Diagnosis Date  . Arthritis   . GERD (gastroesophageal reflux disease)    30 years ago   . History of hiatal hernia   . History of urinary tract infection    35 years ago   . Hyperlipidemia   . Tinnitus   . Wears glasses     Assessment/Plan: 1 Day Post-Op  Procedure(s) (LRB): RIGHT TOTAL HIP ARTHROPLASTY ANTERIOR APPROACH (Right) Active Problems:   OA (osteoarthritis) of hip  Estimated body mass index is 31.31 kg/m as calculated from the following:   Height as of this encounter: 5\' 6"  (1.676 m).   Weight as of this encounter: 88 kg (194 lb). Up with therapy Discharge home with home health  DVT Prophylaxis - Xarelto Weight Bearing As Tolerated right Leg Hemovac Pulled Begin Therapy  If meets goals and able to go home: Up with therapy Discharge home with home health Diet - Cardiac diet Follow up - in 2 weeks Activity - WBAT Disposition - Home Condition Upon Discharge - improving D/C Meds - See DC Summary DVT Prophylaxis - Xarelto  Arlee Muslim, PA-C Orthopaedic Surgery 10/16/2016, 8:06 AM

## 2016-11-18 DIAGNOSIS — Z471 Aftercare following joint replacement surgery: Secondary | ICD-10-CM | POA: Diagnosis not present

## 2016-11-18 DIAGNOSIS — Z96641 Presence of right artificial hip joint: Secondary | ICD-10-CM | POA: Diagnosis not present

## 2017-01-21 DIAGNOSIS — M2170 Unequal limb length (acquired), unspecified site: Secondary | ICD-10-CM | POA: Diagnosis not present

## 2017-01-21 DIAGNOSIS — M1990 Unspecified osteoarthritis, unspecified site: Secondary | ICD-10-CM | POA: Diagnosis not present

## 2017-04-14 DIAGNOSIS — E7800 Pure hypercholesterolemia, unspecified: Secondary | ICD-10-CM | POA: Diagnosis not present

## 2017-04-14 DIAGNOSIS — Z125 Encounter for screening for malignant neoplasm of prostate: Secondary | ICD-10-CM | POA: Diagnosis not present

## 2017-04-14 DIAGNOSIS — Z0000 Encounter for general adult medical examination without abnormal findings: Secondary | ICD-10-CM | POA: Diagnosis not present

## 2018-02-04 DIAGNOSIS — Z96643 Presence of artificial hip joint, bilateral: Secondary | ICD-10-CM | POA: Diagnosis not present

## 2018-02-04 DIAGNOSIS — Z471 Aftercare following joint replacement surgery: Secondary | ICD-10-CM | POA: Diagnosis not present

## 2018-02-05 DIAGNOSIS — J209 Acute bronchitis, unspecified: Secondary | ICD-10-CM | POA: Diagnosis not present

## 2018-02-05 DIAGNOSIS — J302 Other seasonal allergic rhinitis: Secondary | ICD-10-CM | POA: Diagnosis not present

## 2018-05-03 DIAGNOSIS — Z0000 Encounter for general adult medical examination without abnormal findings: Secondary | ICD-10-CM | POA: Diagnosis not present

## 2018-05-03 DIAGNOSIS — E7800 Pure hypercholesterolemia, unspecified: Secondary | ICD-10-CM | POA: Diagnosis not present

## 2018-12-30 ENCOUNTER — Other Ambulatory Visit: Payer: Self-pay | Admitting: Internal Medicine

## 2018-12-30 DIAGNOSIS — R202 Paresthesia of skin: Secondary | ICD-10-CM | POA: Diagnosis not present

## 2018-12-30 DIAGNOSIS — R29898 Other symptoms and signs involving the musculoskeletal system: Secondary | ICD-10-CM | POA: Diagnosis not present

## 2018-12-31 ENCOUNTER — Ambulatory Visit
Admission: RE | Admit: 2018-12-31 | Discharge: 2018-12-31 | Disposition: A | Discharge status: Home / Self Care | Payer: 59 | Source: Ambulatory Visit | Attending: Internal Medicine | Admitting: Internal Medicine

## 2018-12-31 DIAGNOSIS — R29898 Other symptoms and signs involving the musculoskeletal system: Secondary | ICD-10-CM

## 2018-12-31 DIAGNOSIS — R202 Paresthesia of skin: Secondary | ICD-10-CM

## 2018-12-31 DIAGNOSIS — M4802 Spinal stenosis, cervical region: Secondary | ICD-10-CM | POA: Diagnosis not present

## 2018-12-31 DIAGNOSIS — R200 Anesthesia of skin: Secondary | ICD-10-CM | POA: Diagnosis not present

## 2018-12-31 MED ORDER — GADOBENATE DIMEGLUMINE 529 MG/ML IV SOLN
18.0000 mL | Freq: Once | INTRAVENOUS | Status: AC | PRN
  Administered 2018-12-31: 18 mL via INTRAVENOUS

## 2019-01-03 DIAGNOSIS — R202 Paresthesia of skin: Secondary | ICD-10-CM | POA: Diagnosis not present

## 2019-01-04 ENCOUNTER — Other Ambulatory Visit: Payer: Self-pay | Admitting: *Deleted

## 2019-01-04 ENCOUNTER — Encounter: Payer: Self-pay | Admitting: Neurology

## 2019-01-04 DIAGNOSIS — R200 Anesthesia of skin: Secondary | ICD-10-CM

## 2019-01-18 ENCOUNTER — Ambulatory Visit (INDEPENDENT_AMBULATORY_CARE_PROVIDER_SITE_OTHER): Payer: 59 | Admitting: Neurology

## 2019-01-18 DIAGNOSIS — M5412 Radiculopathy, cervical region: Secondary | ICD-10-CM

## 2019-01-18 DIAGNOSIS — R200 Anesthesia of skin: Secondary | ICD-10-CM

## 2019-01-18 DIAGNOSIS — G5620 Lesion of ulnar nerve, unspecified upper limb: Secondary | ICD-10-CM

## 2019-01-18 NOTE — Procedures (Signed)
[facility]  [address]  [place], [place] [address] Tel: [phone] Fax:  [phone] Test Date:  01/18/2019  Patient: Jacob Griffith DOB: 02-01-1956 Physician: Narda Amber, DO  Sex: Male Height: 5\' 8"  Ref Phys: Lavone Orn, MD  ID#: [id number] Temp: 36.0C Technician:    Patient Complaints: This is a 63 year old man referred for evaluation of subacute onset bilateral hand paresthesias and left hand weakness.  NCV & EMG Findings: Extensive electrodiagnostic testing of the left upper extremity and additional studies of the left shows:  1. Bilateral median, ulnar, and mixed palmar sensory responses are within normal limits. 2. 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  Right median motor response is within normal limits.  Left bilateral ulnar motor responses show slowed conduction velocity across the elbow (A Elbow-B Elbow, L42, R30 m/s).   3. Chronic motor axonal loss changes are seen affecting the left C5-C7 myotomes and right C7 myotome.  There is active denervation in the left pronator teres and deltoid muscles.  Impression: 1. 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 2. Chronic C7 radiculopathy affecting bilateral upper extremities, moderate in degree electrically. 3. 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 4. 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   ___________________________ Narda Amber, DO    Nerve Conduction Studies Anti Sensory Summary Table   Site NR Peak (ms) Norm Peak (ms) P-T Amp (V) Norm P-T Amp  Left Median Anti Sensory (2nd  Digit)  36C  Wrist    3.0 <3.8 21.2 >10  Right Median Anti Sensory (2nd Digit)  36C  Wrist    3.0 <3.8 24.7 >10  Left Ulnar Anti Sensory (5th Digit)  36C  Wrist    2.7 <3.2 12.9 >5  Right Ulnar Anti Sensory (5th Digit)  36C  Wrist    2.8 <3.2 12.7 >5   Motor Summary Table   Site NR Onset (ms) Norm Onset (ms) O-P Amp (mV) Norm O-P Amp Site1 Site2 Delta-0 (ms) Dist (cm) Vel (m/s) Norm Vel (m/s)  Left Median Motor (Abd Poll Brev)  36C  Wrist    3.7 <4.0 3.3 >5 Elbow Wrist 7.1 32.0 45 >50  Elbow    10.8  2.0  Ulnar-wrist croosver Elbow 6.3 0.0    Ulnar-wrist croosver    4.5  3.3         Right Median Motor (Abd Poll Brev)  36C  Wrist    3.0 <4.0 7.9 >5 Elbow Wrist 5.0 30.0 60 >50  Elbow    8.0  6.8         Left Ulnar Motor (Abd Dig Minimi)  36C  Wrist    2.3 <3.1 7.2 >7 B Elbow Wrist 4.5 25.0 56 >50  B Elbow    6.8  6.6  A Elbow B Elbow 2.4 10.0 42 >50  A Elbow    9.2  5.7         Right Ulnar Motor (Abd Dig Minimi)  36C  Wrist    2.3 <3.1 8.4 >7 B Elbow Wrist 4.2 24.0 57 >50  B Elbow    6.5  8.4  A Elbow B Elbow 3.3 10.0 30 >50  A Elbow  9.8  7.9          Comparison Summary Table   Site NR Peak (ms) Norm Peak (ms) P-T Amp (V) Site1 Site2 Delta-P (ms) Norm Delta (ms)  Left Median/Ulnar Palm Comparison (Wrist - 8cm)  36C  Median Palm    1.9 <2.2 55.0 Median Palm Ulnar Palm 0.3   Ulnar Palm    1.6 <2.2 12.6      Right Median/Ulnar Palm Comparison (Wrist - 8cm)  36C  Median Palm    2.0 <2.2 36.4 Median Palm Ulnar Palm 0.2   Ulnar Palm    1.8 <2.2 6.7       EMG   Side Muscle Ins Act Fibs Psw Fasc Number Recrt Dur Dur. Amp Amp. Poly Poly. Comment  Right 1stDorInt Nml Nml Nml Nml Nml Nml Nml Nml Nml Nml Nml Nml N/A  Right Ext Indicis Nml Nml Nml Nml Nml Nml Nml Nml Nml Nml Nml Nml N/A  Right PronatorTeres Nml Nml Nml Nml 3- Rapid Most 1+ Most 1+ Most 1+ N/A  Right Biceps Nml Nml Nml Nml Nml Nml Nml Nml Nml Nml Nml Nml N/A  Right Triceps Nml Nml Nml Nml 3- Rapid Most 1+  Most 1+ Most 1+ N/A  Right Deltoid Nml Nml Nml Nml Nml Nml Nml Nml Nml Nml Nml Nml N/A  Left 1stDorInt Nml Nml Nml Nml Nml Nml Nml Nml Nml Nml Nml Nml N/A  Left Ext Indicis Nml Nml Nml Nml Nml Nml Nml Nml Nml Nml Nml Nml N/A  Left ABD Dig Min Nml Nml Nml Nml Nml Nml Nml Nml Nml Nml Nml Nml N/A  Right ABD Dig Min Nml Nml Nml Nml Nml Nml Nml Nml Nml Nml Nml Nml N/A  Left PronatorTeres Nml 1+ Nml Nml 3- Rapid All 1+ Nml Nml All 1+ N/A  Left Biceps Nml Nml Nml Nml 1- Rapid Some 1+ Few 1+ Nml Nml N/A  Left Triceps Nml Nml Nml Nml 2- Rapid Many 1+ Few 1+ Nml Nml N/A  Left Deltoid Nml 1+ Nml Nml 2- Rapid Many 1+ Few 1+ Nml Nml N/A  Left Cervical Parasp Low Nml Nml Nml Nml NE - - - - - - - N/A  Left Infraspinatus Nml Nml Nml Nml 1- Rapid Some 1+ Some 1+ Nml Nml N/A      Waveforms:

## 2019-07-02 IMAGING — MR MR HEAD WO/W CM
2 series · 48 of 48 positions shown · IV contrast (multihance)
Comparison: None.

CLINICAL DATA: 62 y/o M; numbness in both hands that radiate to the
fingers. Loss of strength in the left hand noticeable for the past 7
days.

Creatinine was obtained on site at [HOSPITAL] at [HOSPITAL].
Results: Creatinine 1.0 mg/dL.
EXAM:
MRI HEAD WITHOUT AND WITH CONTRAST
MRI CERVICAL SPINE WITHOUT AND WITH CONTRAST
TECHNIQUE: Multiplanar, multiecho pulse sequences of the brain and surrounding
structures, and cervical spine, to include the craniocervical
junction and cervicothoracic junction, were obtained without and
with intravenous contrast.
CONTRAST:  18mL MULTIHANCE GADOBENATE DIMEGLUMINE 529 MG/ML IV SOLN

[Series 15: T1 post-contrast · coronal · 5.0mm · 0.72mm/px · 8 of 30 slices shown]
[im 1/30]
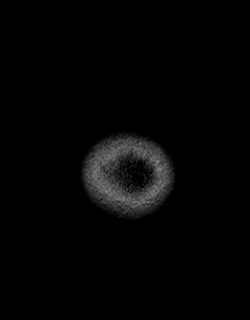
[im 5/30]
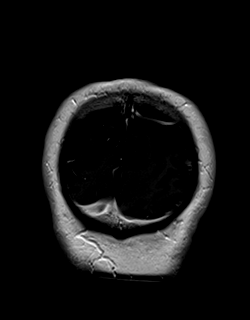
[im 9/30]
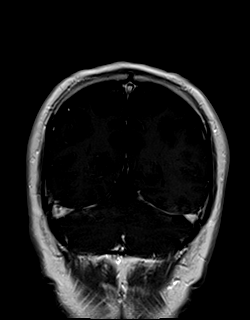
[im 13/30]
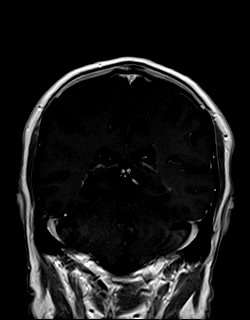
[im 17/30]
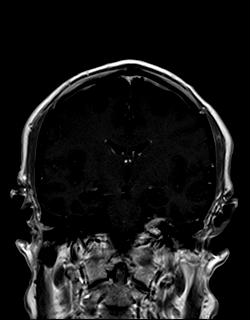
[im 21/30]
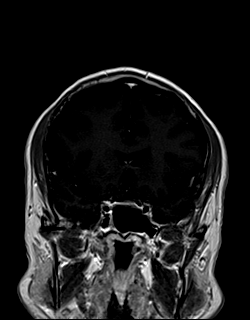
[im 25/30]
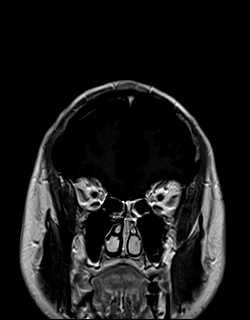
[im 30/30]
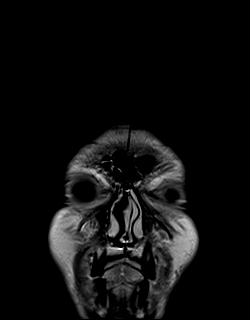

[Series 16: t1_mpr_tra · axial · 1.0mm · 0.72mm/px · z∈[+83,+241]mm · 40 of 160 slices shown]
[im 1/160]
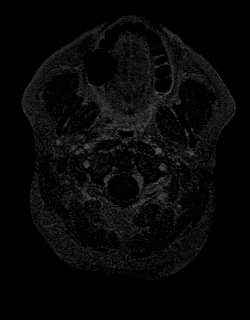
[im 5/160]
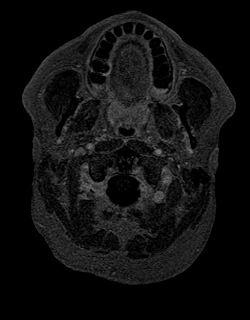
[im 9/160]
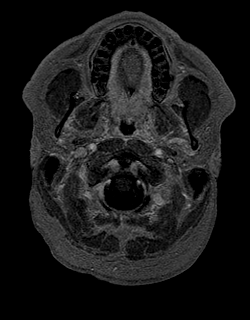
[im 13/160]
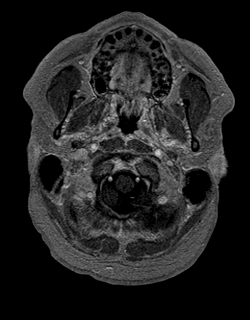
[im 17/160]
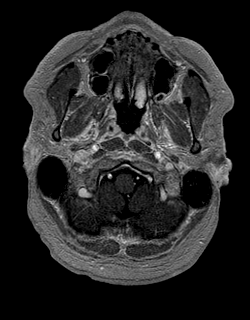
[im 21/160]
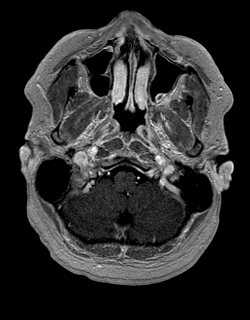
[im 25/160]
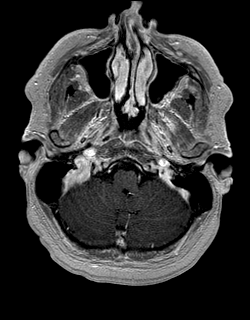
[im 29/160]
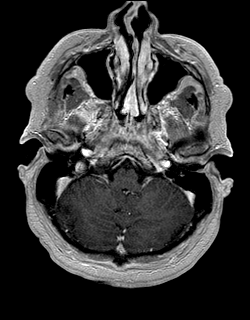
[im 33/160]
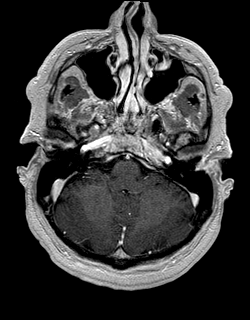
[im 37/160]
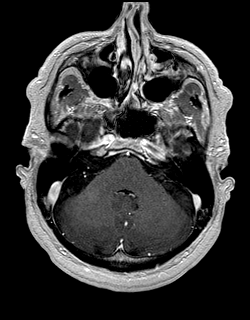
[im 41/160]
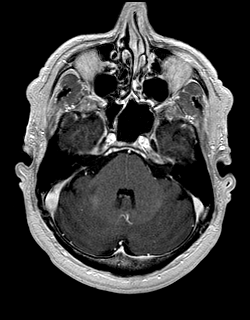
[im 45/160]
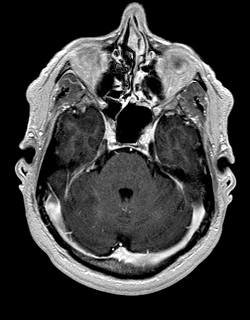
[im 49/160]
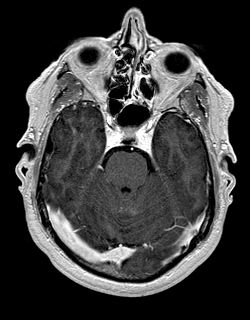
[im 54/160]
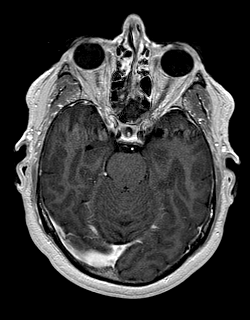
[im 58/160]
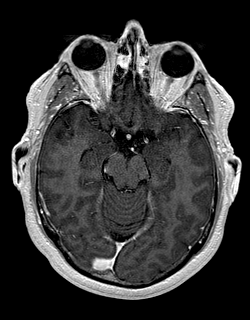
[im 62/160]
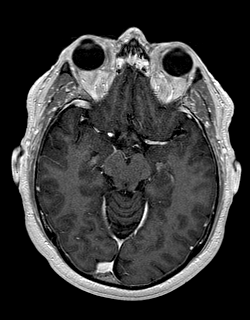
[im 66/160]
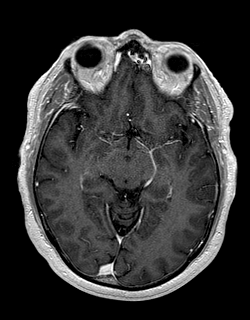
[im 70/160]
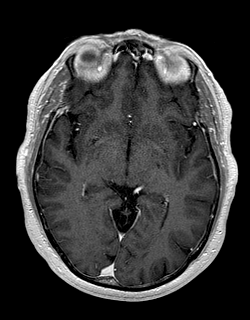
[im 74/160]
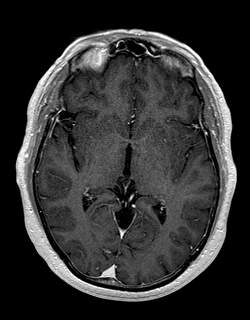
[im 78/160]
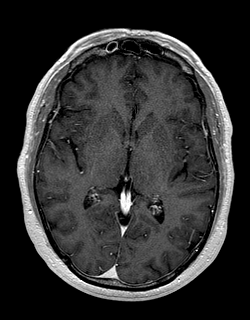
[im 82/160]
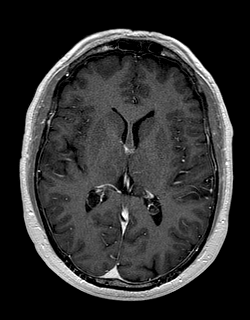
[im 86/160]
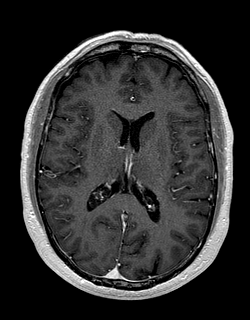
[im 90/160]
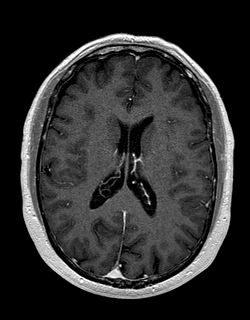
[im 94/160]
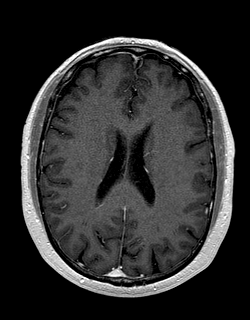
[im 98/160]
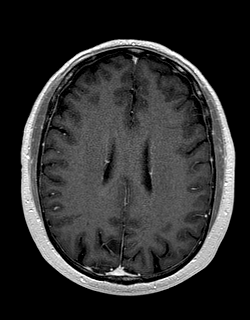
[im 102/160]
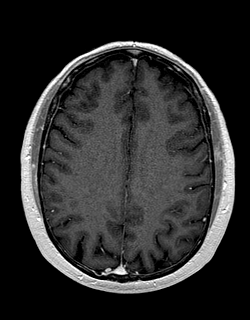
[im 107/160]
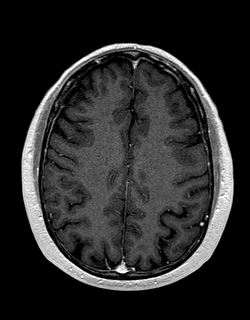
[im 111/160]
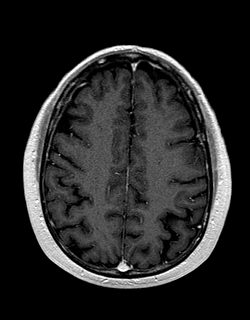
[im 115/160]
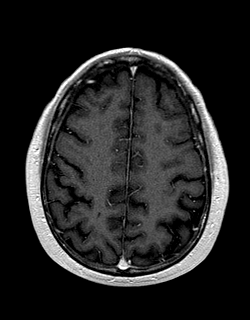
[im 119/160]
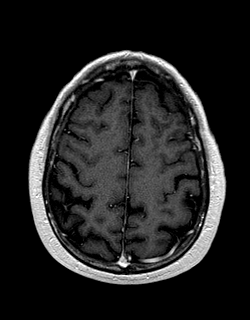
[im 123/160]
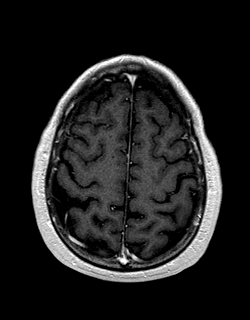
[im 127/160]
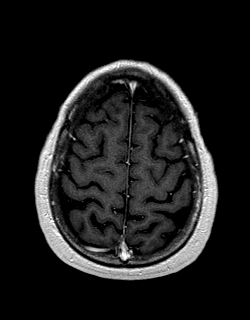
[im 131/160]
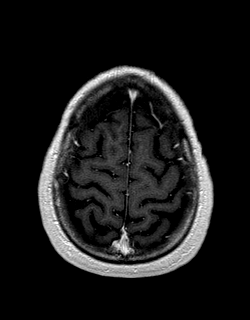
[im 135/160]
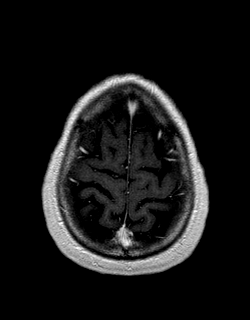
[im 139/160]
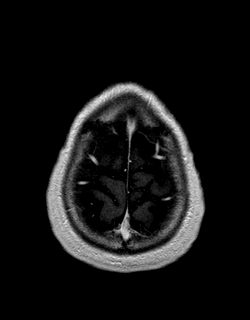
[im 143/160]
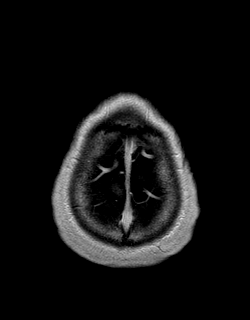
[im 147/160]
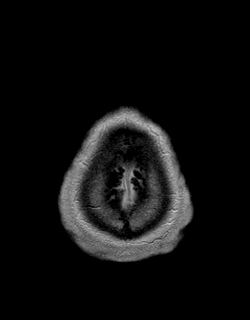
[im 151/160]
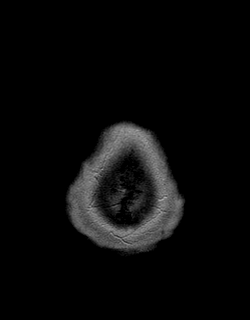
[im 155/160]
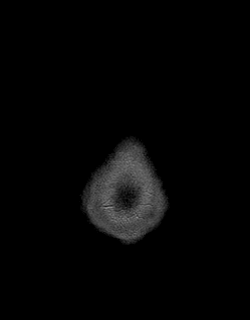
[im 160/160]
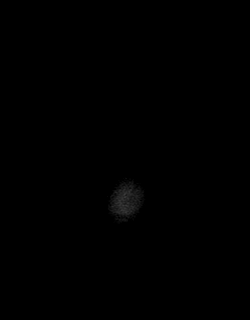

[48 of 48 positions shown; findings below may reference images not displayed]

FINDINGS: MRI HEAD FINDINGS

Brain: No acute infarction, hemorrhage, hydrocephalus, extra-axial
collection or mass lesion. No structural or signal abnormality of
the brain identified. After administration of intravenous contrast
there is no abnormal enhancement.

Vascular: Normal flow voids.

Skull and upper cervical spine: Normal marrow signal.

Sinuses/Orbits: Mild to moderate paranasal sinus mucosal thickening
with small left maxillary sinus mucous retention cyst. Partial
opacification of left mastoid air cells. No abnormal signal of right
mastoid air cells. Orbits are unremarkable.

Other: None.

MRI CERVICAL SPINE FINDINGS

Alignment: C4-5 grade 1 anterolisthesis.

Vertebrae: No fracture, evidence of discitis, or bone lesion. Mild
edema and enhancement within the right C3-4 and C4-5 facet joints.
No additional abnormal enhancement.

Cord: Normal signal and morphology.  No abnormal enhancement.

Posterior Fossa, vertebral arteries, paraspinal tissues: Negative.

Disc levels:

C2-3: Right greater than left facet hypertrophy. Mild right
foraminal stenosis. No canal stenosis.

C3-4: Right greater than left uncovertebral and facet hypertrophy.
Moderate right and mild left foraminal stenosis. No canal stenosis.

C4-5: Disc osteophyte complex eccentric to the right with prominent
bilateral uncovertebral and facet hypertrophy. Severe bilateral
neural foraminal stenosis mild spinal canal stenosis.

C5-6: Disc osteophyte complex with prominent bilateral uncovertebral
and facet hypertrophy. Severe bilateral neural foraminal stenosis.
Mild spinal canal stenosis.

C6-7: Disc osteophyte complex with right greater than left
uncovertebral and facet hypertrophy. Severe right and moderate left
foraminal stenosis. Mild spinal canal stenosis.

C7-T1: No significant disc displacement, foraminal stenosis, or
canal stenosis.
IMPRESSION: MRI of the head:

1. No acute intracranial process or abnormal enhancement of the
brain. Unremarkable MRI of the brain.
2. Mild to moderate paranasal sinus disease.

MRI of the cervical spine:

1. Mild right C3-4 and C4-5 facet joint edema and enhancement,
likely degenerative.
2. No additional abnormal osseous or cord signal abnormality.
3. Spondylosis of the cervical spine greatest at the C4-5 through
C6-7 levels where there is prominent uncovertebral and facet
hypertrophy resulting in multiple levels of severe foraminal
stenosis.
4. Mild C4-5 through C6-7 spinal canal stenosis. No high-grade
spinal canal stenosis.

## 2019-12-14 DIAGNOSIS — G5602 Carpal tunnel syndrome, left upper limb: Secondary | ICD-10-CM | POA: Insufficient documentation

## 2020-02-04 ENCOUNTER — Ambulatory Visit: Payer: 59 | Attending: Internal Medicine

## 2020-02-04 DIAGNOSIS — Z23 Encounter for immunization: Secondary | ICD-10-CM

## 2020-02-04 NOTE — Progress Notes (Signed)
   Covid-19 Vaccination Clinic  Name:  Jacob Griffith    MRN: [id number] DOB: Jun 29, 1956  02/04/2020  [name] was observed post Covid-19 immunization for 15 minutes without incident. He was provided with Vaccine Information Sheet and instruction to access the V-Safe system.   [name] was instructed to call 911 with any severe reactions post vaccine: Marland Kitchen Difficulty breathing  . Swelling of face and throat  . A fast heartbeat  . A bad rash all over body  . Dizziness and weakness   Immunizations Administered    Name Date Dose VIS Date Route   Moderna COVID-19 Vaccine 02/04/2020  9:34 AM 0.5 mL 10/18/2019 Intramuscular   Manufacturer: Moderna   Lot: [id number]   [id number]

## 2020-03-07 ENCOUNTER — Ambulatory Visit: Payer: 59 | Attending: Internal Medicine

## 2020-03-07 DIAGNOSIS — Z23 Encounter for immunization: Secondary | ICD-10-CM

## 2020-03-07 NOTE — Progress Notes (Signed)
   Covid-19 Vaccination Clinic  Name:  Jacob Griffith    MRN: [id number] DOB: March 07, 1956  03/07/2020  [name] was observed post Covid-19 immunization for 15 minutes without incident. He was provided with Vaccine Information Sheet and instruction to access the V-Safe system.   [name] was instructed to call 911 with any severe reactions post vaccine: Marland Kitchen Difficulty breathing  . Swelling of face and throat  . A fast heartbeat  . A bad rash all over body  . Dizziness and weakness   Immunizations Administered    Name Date Dose VIS Date Route   Moderna COVID-19 Vaccine 03/07/2020  2:56 PM 0.5 mL 10/2019 Intramuscular   Manufacturer: Moderna   Lot: [id number]   [id number]

## 2021-03-20 ENCOUNTER — Encounter: Payer: Self-pay | Admitting: Gastroenterology

## 2021-03-29 ENCOUNTER — Encounter: Payer: Self-pay | Admitting: Gastroenterology

## 2021-04-17 ENCOUNTER — Telehealth: Payer: Self-pay | Admitting: *Deleted

## 2021-04-17 NOTE — Telephone Encounter (Signed)
While prepping chart for PV, I noted Xarelto on pt's medications list.  I attempted to reach him to see if he is on this medicine.  Unable to reach him, so LMOM to call office back

## 2021-04-18 NOTE — Telephone Encounter (Signed)
Pt is not taking Xarelto any longer

## 2021-05-01 ENCOUNTER — Other Ambulatory Visit: Payer: Self-pay

## 2021-05-01 ENCOUNTER — Ambulatory Visit (AMBULATORY_SURGERY_CENTER): Payer: 59

## 2021-05-01 VITALS — Ht 66.0 in | Wt 195.6 lb

## 2021-05-01 DIAGNOSIS — Z1211 Encounter for screening for malignant neoplasm of colon: Secondary | ICD-10-CM

## 2021-05-01 MED ORDER — SUTAB 1479-225-188 MG PO TABS: 1.0000 | ORAL_TABLET | ORAL | 0 refills | Status: DC

## 2021-05-01 NOTE — Progress Notes (Signed)
Denies allergies to eggs or soy products. Denies complication of anesthesia or sedation. Denies use of weight loss medication. Denies use of O2.   Emmi instructions given for colonoscopy.  

## 2021-05-13 ENCOUNTER — Encounter: Payer: Self-pay | Admitting: Gastroenterology

## 2021-05-15 ENCOUNTER — Ambulatory Visit (AMBULATORY_SURGERY_CENTER): Payer: 59 | Admitting: Gastroenterology

## 2021-05-15 ENCOUNTER — Encounter: Payer: Self-pay | Admitting: Gastroenterology

## 2021-05-15 ENCOUNTER — Other Ambulatory Visit: Payer: Self-pay

## 2021-05-15 VITALS — BP 147/88 | HR 56 | Temp 97.8°F | Resp 15 | Ht 66.0 in | Wt 195.0 lb

## 2021-05-15 DIAGNOSIS — Z1211 Encounter for screening for malignant neoplasm of colon: Secondary | ICD-10-CM | POA: Diagnosis not present

## 2021-05-15 DIAGNOSIS — D125 Benign neoplasm of sigmoid colon: Secondary | ICD-10-CM

## 2021-05-15 DIAGNOSIS — K641 Second degree hemorrhoids: Secondary | ICD-10-CM

## 2021-05-15 DIAGNOSIS — D124 Benign neoplasm of descending colon: Secondary | ICD-10-CM

## 2021-05-15 DIAGNOSIS — K5730 Diverticulosis of large intestine without perforation or abscess without bleeding: Secondary | ICD-10-CM

## 2021-05-15 MED ORDER — SODIUM CHLORIDE 0.9 % IV SOLN: 500.0000 mL | Freq: Once | INTRAVENOUS | Status: AC

## 2021-05-15 NOTE — Patient Instructions (Signed)
Await pathology  Please read over handouts about polyps, hemorrhoids and diverticulosis  Continue your normal medications  YOU HAD AN ENDOSCOPIC PROCEDURE TODAY AT [facility]:   Refer to the procedure report that was given to you for any specific questions about what was found during the examination.  If the procedure report does not answer your questions, please call your gastroenterologist to clarify.  If you requested that your care partner not be given the details of your procedure findings, then the procedure report has been included in a sealed envelope for you to review at your convenience later.  YOU SHOULD EXPECT: Some feelings of bloating in the abdomen. Passage of more gas than usual.  Walking can help get rid of the air that was put into your GI tract during the procedure and reduce the bloating. If you had a lower endoscopy (such as a colonoscopy or flexible sigmoidoscopy) you may notice spotting of blood in your stool or on the toilet paper. If you underwent a bowel prep for your procedure, you may not have a normal bowel movement for a few days.  Please Note:  You might notice some irritation and congestion in your nose or some drainage.  This is from the oxygen used during your procedure.  There is no need for concern and it should clear up in a day or so.  SYMPTOMS TO REPORT IMMEDIATELY:  Following lower endoscopy (colonoscopy or flexible sigmoidoscopy):  Excessive amounts of blood in the stool  Significant tenderness or worsening of abdominal pains  Swelling of the abdomen that is new, acute  Fever of 100F or higher For urgent or emergent issues, a gastroenterologist can be reached at any hour by calling [phone]. Do not use MyChart messaging for urgent concerns.    DIET:  We do recommend a small meal at first, but then you may proceed to your regular diet.  Drink plenty of fluids but you should avoid alcoholic beverages for 24 hours.  ACTIVITY:   You should plan to take it easy for the rest of today and you should NOT DRIVE or use heavy machinery until tomorrow (because of the sedation medicines used during the test).    FOLLOW UP: Our staff will call the number listed on your records 48-72 hours following your procedure to check on you and address any questions or concerns that you may have regarding the information given to you following your procedure. If we do not reach you, we will leave a message.  We will attempt to reach you two times.  During this call, we will ask if you have developed any symptoms of COVID 19. If you develop any symptoms (ie: fever, flu-like symptoms, shortness of breath, cough etc.) before then, please call [phone].  If you test positive for Covid 19 in the 2 weeks post procedure, please call and report this information to [place].    If any biopsies were taken you will be contacted by phone or by letter within the next 1-3 weeks.  Please call us at [phone] if you have not heard about the biopsies in 3 weeks.    SIGNATURES/CONFIDENTIALITY: You and/or your care partner have signed paperwork which will be entered into your electronic medical record.  These signatures attest to the fact that that the information above on your After Visit Summary has been reviewed and is understood.  Full responsibility of the confidentiality of this discharge information lies with you and/or your care-partner.

## 2021-05-15 NOTE — Progress Notes (Signed)
Pt's states no medical or surgical changes since previsit or office visit. 

## 2021-05-15 NOTE — Op Note (Signed)
[facility] Patient Name: Jacob Griffith Procedure Date: 05/15/2021 9:24 AM MRN: [id number] Endoscopist: Gerrit Heck , MD Age: 65 Referring MD:  Date of Birth: [birth year] Gender: Male Account #: [id number] Procedure:                Colonoscopy Indications:              Screening for colorectal malignant neoplasm. Last                            colonoscopy was 10 years ago and notable for single                            hyperplastic polyp. Otherwise no active GI symptoms. Medicines:                Monitored Anesthesia Care Procedure:                Pre-Anesthesia Assessment:                           - Prior to the procedure, a History and Physical                            was performed, and patient medications and                            allergies were reviewed. The patient's tolerance of                            previous anesthesia was also reviewed. The risks                            and benefits of the procedure and the sedation                            options and risks were discussed with the patient.                            All questions were answered, and informed consent                            was obtained. Prior Anticoagulants: The patient has                            taken no previous anticoagulant or antiplatelet                            agents. ASA Grade Assessment: II - A patient with                            mild systemic disease. After reviewing the risks                            and benefits, the patient was deemed in  satisfactory condition to undergo the procedure.                           After obtaining informed consent, the colonoscope                            was passed under direct vision. Throughout the                            procedure, the patient's blood pressure, pulse, and                            oxygen saturations were monitored continuously. The                            CF  [id number] #[id number] was introduced through the anus                            and advanced to the the cecum, identified by the                            appendiceal orifice. The colonoscopy was performed                            without difficulty. The patient tolerated the                            procedure well. The quality of the bowel                            preparation was good. The ileocecal valve,                            appendiceal orifice, and rectum were photographed. Scope In: 9:29:31 AM Scope Out: 9:48:00 AM Scope Withdrawal Time: 0 hours 16 minutes 27 seconds  Total Procedure Duration: 0 hours 18 minutes 29 seconds  Findings:                 Hemorrhoids were found on perianal exam.                           Four sessile polyps were found in the sigmoid colon                            (1) and descending colon (3). The polyps were 2 to                            4 mm in size. These polyps were removed with a cold                            snare. Resection and retrieval were complete.                            Estimated blood  loss was minimal.                           Multiple small and large-mouthed diverticula were                            found in the sigmoid colon and ascending colon.                           Non-bleeding internal hemorrhoids were found during                            retroflexion. The hemorrhoids were small. Complications:            No immediate complications. Estimated Blood Loss:     Estimated blood loss was minimal. Impression:               - Hemorrhoids found on perianal exam.                           - Four 2 to 4 mm polyps in the sigmoid colon and in                            the descending colon, removed with a cold snare.                            Resected and retrieved.                           - Diverticulosis in the sigmoid colon and in the                            ascending colon.                           -  Non-bleeding internal hemorrhoids. Recommendation:           - Patient has a contact number available for                            emergencies. The signs and symptoms of potential                            delayed complications were discussed with the                            patient. Return to normal activities tomorrow.                            Written discharge instructions were provided to the                            patient.                           - Resume previous diet.                           -  Continue present medications.                           - Await pathology results.                           - Repeat colonoscopy for surveillance based on                            pathology results.                           - Return to GI clinic PRN. Gerrit Heck, MD 05/15/2021 9:54:44 AM

## 2021-05-15 NOTE — Progress Notes (Signed)
C.W. vital signs. 

## 2021-05-15 NOTE — Progress Notes (Signed)
PT taken to PACU. Monitors in place. VSS. Report given to RN. 

## 2021-05-15 NOTE — Progress Notes (Signed)
Called to room to assist during endoscopic procedure.  Patient ID and intended procedure confirmed with present staff. Received instructions for my participation in the procedure from the performing physician.  

## 2021-05-17 ENCOUNTER — Telehealth: Payer: Self-pay | Admitting: *Deleted

## 2021-05-17 NOTE — Telephone Encounter (Signed)
Message left

## 2021-05-17 NOTE — Telephone Encounter (Signed)
  Follow up Call-  Call back number 05/15/2021  Post procedure Call Back phone  # [phone]  Permission to leave phone message Yes  Some recent data might be hidden     Patient questions:  Do you have a fever, pain , or abdominal swelling? No. Pain Score  0 *  Have you tolerated food without any problems? Yes.    Have you been able to return to your normal activities? Yes.    Do you have any questions about your discharge instructions: Diet   No. Medications  No. Follow up visit  No.  Do you have questions or concerns about your Care? No.  Actions: * If pain score is 4 or above: No action needed, pain <4.  Have you developed a fever since your procedure? no  2.   Have you had an respiratory symptoms (SOB or cough) since your procedure? no  3.   Have you tested positive for COVID 19 since your procedure no  4.   Have you had any family members/close contacts diagnosed with the COVID 19 since your procedure?  no   If yes to any of these questions please route to Joylene John, RN and Joella Prince, RN

## 2021-05-27 ENCOUNTER — Encounter: Payer: Self-pay | Admitting: Gastroenterology

## 2023-02-23 ENCOUNTER — Ambulatory Visit
Admission: RE | Admit: 2023-02-23 | Discharge: 2023-02-23 | Disposition: A | Discharge status: Home / Self Care | Payer: Managed Care, Other (non HMO) | Source: Ambulatory Visit | Attending: Physician Assistant | Admitting: Physician Assistant

## 2023-02-23 ENCOUNTER — Other Ambulatory Visit: Payer: Self-pay | Admitting: Physician Assistant

## 2023-02-23 DIAGNOSIS — R051 Acute cough: Secondary | ICD-10-CM

## 2023-03-17 ENCOUNTER — Ambulatory Visit: Payer: Self-pay | Admitting: Allergy

## 2023-03-18 NOTE — Progress Notes (Signed)
New Patient Note  RE: Jacob Griffith MRN: [id number] DOB: June 28, 1956 Date of Office Visit: 03/19/2023  Consult requested by: Kirby Funk, MD Primary care provider: [name], [name]  Chief Complaint: Allergic Rhinitis  (Congestion makes his throat tickle which causes him to cough. Mostly affected in the Middle of Jan to the middle of April. )  History of Present Illness: I had the pleasure of seeing [name] for initial evaluation at the [facility] on 03/20/2023. He is a 67 y.o. male, who is referred here by [name], PA for the evaluation of allergic rhinitis.  He reports symptoms of nasal congestion, PND, coughing, burny eyes. Symptoms have been going on for 15 years. The symptoms are present mainly from the end of January to April. Symptoms seem to be worsening each year. Currently feeling okay.  Other triggers include exposure to unknown. Anosmia: no. Headache: no. He has used Flonase prn, Xyzal 5mg  with some improvement in symptoms. Sinus infections: no. Previous work up includes: none. Some rhinitis symptoms after eating.   Previous ENT evaluation: yes for tinnitus. Previous sinus imaging: no. History of nasal polyps: no. Last eye exam: last year. History of reflux: yes - takes famotidine prn.  Assessment and Plan: Nikolus is a 67 y.o. male with: Other allergic rhinitis Worsening rhino conjunctivitis symptoms from January to April only. Tried Flonase, Xyzal with some benefit. No prior allergy testing. Today's skin testing showed: Positive to maple mix, mold, cat, dog and borderline to dust mites. Negative to common foods.  Start environmental control measures as below.  Start taking these medications in January.  Use over the counter antihistamines such as Zyrtec (cetirizine), Claritin (loratadine), Allegra (fexofenadine), or Xyzal (levocetirizine) daily as needed. May take twice a day during allergy flares. May switch antihistamines  every few months. Use Flonase (fluticasone) nasal spray 1 spray per nostril twice a day as needed for nasal congestion.  Use azelastine nasal spray 1-2 sprays per nostril twice a day as needed for runny nose/drainage. Nasal saline spray (i.e., Simply Saline) or nasal saline lavage (i.e., NeilMed) is recommended as needed and prior to medicated nasal sprays.  Gastroesophageal reflux disease See handout for lifestyle and dietary modifications. May take famotidine as needed.  Mild intermittent reactive airway disease Used albuterol in March when sick with flu but his allergies were also flaring. Today's spirometry showed some mild restriction. Monitor symptoms. May use albuterol rescue inhaler 2 puffs every 4 to 6 hours as needed for shortness of breath, chest tightness, coughing, and wheezing. Monitor frequency of use.   Return in about 9 months (around 12/20/2023).  Meds ordered this encounter  Medications   azelastine (ASTELIN) 0.1 % nasal spray    Sig: Place 1-2 sprays into both nostrils 2 (two) times daily as needed (nasal drainage). Use in each nostril as directed    Dispense:  30 mL    Refill:  5   fluticasone (FLONASE) 50 MCG/ACT nasal spray    Sig: Place 1 spray into both nostrils 2 (two) times daily as needed (nasal congestion).    Dispense:  16 g    Refill:  5   Lab Orders  No laboratory test(s) ordered today    Other allergy screening: Asthma: no Used albuterol in March when he was sick with the flu. Food allergy: no Medication allergy: yes Hymenoptera allergy: no Urticaria: no Eczema:no History of recurrent infections suggestive of immunodeficency: no  Diagnostics: Spirometry:  Tracings reviewed. His effort:  Good reproducible efforts. FVC: 3.99L FEV1: 3.19L, 78% predicted FEV1/FVC ratio: 80% Interpretation: Spirometry consistent with possible restrictive disease.  Please see scanned spirometry results for details.  Skin Testing: Environmental allergy panel  and select foods. Positive to maple mix, mold, cat, dog and borderline to dust mites. Negative to common foods.  Results discussed with patient/family.  Airborne Adult Perc - 03/19/23 1457     Time Antigen Placed 1458    Allergen Manufacturer Waynette Buttery    Location Back    Number of Test 59    1. Control-Buffer 50% Glycerol Negative    2. Control-Histamine 1 mg/ml 2+    3. Albumin saline Negative    4. Bahia Negative    5. French Southern Territories Negative    6. Johnson Negative    7. Kentucky Blue Negative    8. Meadow Fescue Negative    9. Perennial Rye Negative    10. Sweet Vernal Negative    11. Timothy Negative    12. Cocklebur Negative    13. Burweed Marshelder Negative    14. Ragweed, short Negative    15. Ragweed, Giant Negative    16. Plantain,  English Negative    17. Lamb's Quarters Negative    18. Sheep Sorrell Negative    19. Rough Pigweed Negative    20. Marsh Elder, Rough Negative    21. Mugwort, Common Negative    22. Ash mix Negative    23. Birch mix Negative    24. Beech American Negative    25. Box, Elder Negative    26. Cedar, red Negative    27. Cottonwood, Guinea-Bissau Negative    28. Elm mix Negative    29. Hickory Negative    30. Maple mix 2+    31. Oak, Guinea-Bissau mix Negative    32. Pecan Pollen Negative    33. Pine mix Negative    34. Sycamore Eastern Negative    35. Walnut, Black Pollen Negative    36. Alternaria alternata Negative    37. Cladosporium Herbarum Negative    38. Aspergillus mix Negative    39. Penicillium mix Negative    40. Bipolaris sorokiniana (Helminthosporium) Negative    41. Drechslera spicifera (Curvularia) Negative    42. Mucor plumbeus Negative    43. Fusarium moniliforme Negative    44. Aureobasidium pullulans (pullulara) Negative    45. Rhizopus oryzae Negative    46. Botrytis cinera Negative    47. Epicoccum nigrum Negative    48. Phoma betae Negative    49. Candida Albicans Negative    50. Trichophyton mentagrophytes Negative    51.  Mite, D Farinae  5,000 AU/ml Negative    52. Mite, D Pteronyssinus  5,000 AU/ml Negative    53. Cat Hair 10,000 BAU/ml Negative    54.  Dog Epithelia Negative    55. Mixed Feathers Negative    56. Horse Epithelia Negative    57. Cockroach, German Negative    58. Mouse Negative    59. Tobacco Leaf Negative             Food Perc - 03/19/23 1458       Test Information   Time Antigen Placed 1458    Allergen Manufacturer Waynette Buttery    Location Back    Number of allergen test 10      Food   1. Peanut Negative    2. Soybean food Negative    3. Wheat, whole Negative    4. Sesame Negative  5. Milk, cow Negative    6. Egg White, chicken Negative    7. Casein Negative    8. Shellfish mix Negative    9. Fish mix Negative    10. Cashew Negative             Intradermal - 03/19/23 1534     Time Antigen Placed 0330    Allergen Manufacturer Waynette Buttery    Location Arm    Number of Test 14    Intradermal Select    Control Negative    French Southern Territories Negative    Johnson Negative    7 Grass Negative    Ragweed mix Negative    Weed mix Negative    Mold 1 Negative    Mold 2 2+    Mold 3 2+    Mold 4 2+    Cat 3+    Dog 3+    Cockroach Negative    Mite mix --   +/-            Past Medical History: Patient Active Problem List   Diagnosis Date Noted   Mild intermittent reactive airway disease 03/20/2023   Other allergic rhinitis 03/19/2023   Chronic coughing 03/19/2023   Gastroesophageal reflux disease 03/19/2023   OA (osteoarthritis) of hip 04/23/2016   Past Medical History:  Diagnosis Date   Allergy    Arthritis    GERD (gastroesophageal reflux disease)    30 years ago    History of hiatal hernia    History of urinary tract infection    35 years ago    Hyperlipidemia    Tinnitus    Wears glasses    Past Surgical History: Past Surgical History:  Procedure Laterality Date   ACL repair left knee     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   Procedure: LEFT TOTAL HIP  ARTHROPLASTY ANTERIOR APPROACH;  Surgeon: Ollen Gross, MD;  Location: [facility];  Service: Orthopedics;  Laterality: Left;   TOTAL HIP ARTHROPLASTY Right 10/15/2016   Procedure: RIGHT TOTAL HIP ARTHROPLASTY ANTERIOR APPROACH;  Surgeon: Ollen Gross, MD;  Location: [facility];  Service: Orthopedics;  Laterality: Right;   WRIST SURGERY Left    Medication List:  Current Outpatient Medications  Medication Sig Dispense Refill   albuterol (VENTOLIN HFA) 108 (90 Base) MCG/ACT inhaler 2 puffs as needed Inhalation every 6 hrs for 10 days     ALPRAZolam (XANAX) 0.5 MG tablet Take 0.5 mg by mouth at bedtime as needed for sleep.     atorvastatin (LIPITOR) 20 MG tablet Take 20 mg by mouth daily.     azelastine (ASTELIN) 0.1 % nasal spray Place 1-2 sprays into both nostrils 2 (two) times daily as needed (nasal drainage). Use in each nostril as directed 30 mL 5   calcium-vitamin D (OSCAL WITH D) 500-200 MG-UNIT tablet Take 1 tablet by mouth.     fluticasone (FLONASE) 50 MCG/ACT nasal spray Place 1 spray into both nostrils 2 (two) times daily as needed (nasal congestion). 16 g 5   Garlic 10 MG CAPS garlic     glucosamine-chondroitin 500-400 MG tablet Take 1 tablet by mouth 1 day or 1 dose.     levocetirizine (XYZAL) 5 MG tablet Take 5 mg by mouth daily.     Multiple Vitamins-Minerals (CENTRUM SILVER) tablet as directed Orally     Vitamin D, Cholecalciferol, 10 MCG (400 UNIT) CAPS 1 capsule     methocarbamol (ROBAXIN) 500 MG tablet Take 1 tablet (500 mg total) by mouth  every 6 (six) hours as needed for muscle spasms. 80 tablet 0   rivaroxaban (XARELTO) 10 MG TABS tablet Take 1 tablet (10 mg total) by mouth daily with breakfast. Take Xarelto for two and a half more weeks, then discontinue Xarelto. Once the patient has completed the Xarelto, they may resume the 81 mg Aspirin. 20 tablet 0   Current Facility-Administered Medications  Medication Dose Route Frequency Provider Last Rate Last Admin   0.9 %  sodium  chloride infusion  500 mL Intravenous Once Cirigliano, Vito V, DO       Allergies: Allergies  Allergen Reactions   Amoxicillin Hives and Other (See Comments)    Every joint in body hurt Has patient had a PCN reaction causing immediate rash, facial/tongue/throat swelling, SOB or lightheadedness with hypotension: no Has patient had a PCN reaction causing severe rash involving mucus membranes or skin necrosis: no Has patient had a PCN reaction that required hospitalization no Has patient had a PCN reaction occurring within the last 10 years: no If all of the above answers are "NO", then may proceed w   Aspirin     Other Reaction(s): stomach irritation   Dilaudid [Hydromorphone] Nausea And Vomiting    Tolerates oxycodone, morphine, tramadol   Social History: Social History   Socioeconomic History   Marital status: Married    Spouse name: Not on file   Number of children: Not on file   Years of education: Not on file   Highest education level: Not on file  Occupational History   Not on file  Tobacco Use   Smoking status: Former    Packs/day: 0.25    Years: 2.00    Additional pack years: 0.00    Total pack years: 0.50    Types: Cigarettes    Quit date: 11/18/1975    Years since quitting: 47.3   Smokeless tobacco: Never  Vaping Use   Vaping Use: Never used  Substance and Sexual Activity   Alcohol use: Yes    Comment: 2 beers daily    Drug use: No   Sexual activity: Not on file  Other Topics Concern   Not on file  Social History Narrative   Not on file   Social Determinants of Health   Financial Resource Strain: Not on file  Food Insecurity: Not on file  Transportation Needs: Not on file  Physical Activity: Not on file  Stress: Not on file  Social Connections: Not on file   Lives in a 65 year old house. Smoking: denies Occupation: retired  Landscape architect HistorySurveyor, minerals in the house: no Engineer, civil (consulting) in the family room: yes Carpet in the bedroom:  yes Heating: heat pump Cooling: heat pump Pet: no  Family History: Family History  Problem Relation Age of Onset   Cancer Father    Colon cancer Neg Hx    Esophageal cancer Neg Hx    Rectal cancer Neg Hx    Stomach cancer Neg Hx    Allergic rhinitis Neg Hx    Angioedema Neg Hx    Asthma Neg Hx    Eczema Neg Hx    Urticaria Neg Hx    Review of Systems  Constitutional:  Negative for appetite change, chills, fever and unexpected weight change.  HENT:  Positive for congestion and postnasal drip. Negative for rhinorrhea.   Eyes:  Negative for itching.  Respiratory:  Positive for cough. Negative for chest tightness, shortness of breath and wheezing.   Cardiovascular:  Negative for chest pain.  Gastrointestinal:  Negative for abdominal pain.  Genitourinary:  Negative for difficulty urinating.  Skin:  Negative for rash.  Allergic/Immunologic: Positive for environmental allergies. Negative for food allergies.  Neurological:  Negative for headaches.    Objective: BP 112/78   Pulse 84   Temp 98.3 F (36.8 C)   Resp 16   Ht 5\' 9"  (1.753 m)   Wt 202 lb (91.6 kg)   SpO2 95%   BMI 29.83 kg/m  Body mass index is 29.83 kg/m. Physical Exam Vitals and nursing note reviewed.  Constitutional:      Appearance: Normal appearance. He is well-developed.  HENT:     Head: Normocephalic and atraumatic.     Right Ear: Tympanic membrane and external ear normal.     Left Ear: Tympanic membrane and external ear normal.     Nose: Nose normal.     Mouth/Throat:     Mouth: Mucous membranes are moist.     Pharynx: Oropharynx is clear.  Eyes:     Conjunctiva/sclera: Conjunctivae normal.  Cardiovascular:     Rate and Rhythm: Normal rate and regular rhythm.     Heart sounds: Normal heart sounds. No murmur heard.    No friction rub. No gallop.  Pulmonary:     Effort: Pulmonary effort is normal.     Breath sounds: Normal breath sounds. No wheezing, rhonchi or rales.  Musculoskeletal:      Cervical back: Neck supple.  Skin:    General: Skin is warm.     Findings: No rash.  Neurological:     Mental Status: He is alert and oriented to person, place, and time.  Psychiatric:        Behavior: Behavior normal.   The plan was reviewed with the patient/family, and all questions/concerned were addressed.  It was my pleasure to see Mable today and participate in his care. Please feel free to contact me with any questions or concerns.  Sincerely,  Wyline Mood, DO Allergy & Immunology  [facility] office: [phone] [facility] office: [phone]

## 2023-03-19 ENCOUNTER — Other Ambulatory Visit: Payer: Self-pay

## 2023-03-19 ENCOUNTER — Encounter: Payer: Self-pay | Admitting: Allergy

## 2023-03-19 ENCOUNTER — Ambulatory Visit: Payer: Medicare HMO | Admitting: Allergy

## 2023-03-19 VITALS — BP 112/78 | HR 84 | Temp 98.3°F | Resp 16 | Ht 69.0 in | Wt 202.0 lb

## 2023-03-19 DIAGNOSIS — J4520 Mild intermittent asthma, uncomplicated: Secondary | ICD-10-CM | POA: Diagnosis not present

## 2023-03-19 DIAGNOSIS — R053 Chronic cough: Secondary | ICD-10-CM

## 2023-03-19 DIAGNOSIS — J3089 Other allergic rhinitis: Secondary | ICD-10-CM | POA: Diagnosis not present

## 2023-03-19 DIAGNOSIS — K219 Gastro-esophageal reflux disease without esophagitis: Secondary | ICD-10-CM | POA: Diagnosis not present

## 2023-03-19 MED ORDER — FLUTICASONE PROPIONATE 50 MCG/ACT NA SUSP: 1.0000 | Freq: Two times a day (BID) | NASAL | 5 refills | Status: DC | PRN

## 2023-03-19 MED ORDER — AZELASTINE HCL 0.1 % NA SOLN: 1.0000 | Freq: Two times a day (BID) | NASAL | 5 refills | Status: DC | PRN

## 2023-03-19 NOTE — Patient Instructions (Addendum)
Today's skin testing showed: Positive to maple mix, mold, cat, dog and borderline to dust mites. Negative to common foods.   Results given.  Environmental allergies Start environmental control measures as below.  Start taking these medications in January.  Use over the counter antihistamines such as Zyrtec (cetirizine), Claritin (loratadine), Allegra (fexofenadine), or Xyzal (levocetirizine) daily as needed. May take twice a day during allergy flares. May switch antihistamines every few months. Use Flonase (fluticasone) nasal spray 1 spray per nostril twice a day as needed for nasal congestion.  Use azelastine nasal spray 1-2 sprays per nostril twice a day as needed for runny nose/drainage. Nasal saline spray (i.e., Simply Saline) or nasal saline lavage (i.e., NeilMed) is recommended as needed and prior to medicated nasal sprays.  Heartburn: See handout for lifestyle and dietary modifications. May take famotidine as needed.  Follow up in February or sooner if needed.   Reducing Pollen Exposure Pollen seasons: trees (spring), grass (summer) and ragweed/weeds (fall). Keep windows closed in your home and car to lower pollen exposure.  Install air conditioning in the bedroom and throughout the house if possible.  Avoid going out in dry windy days - especially early morning. Pollen counts are highest between 5 - 10 AM and on dry, hot and windy days.  Save outside activities for late afternoon or after a heavy rain, when pollen levels are lower.  Avoid mowing of grass if you have grass pollen allergy. Be aware that pollen can also be transported indoors on people and pets.  Dry your clothes in an automatic dryer rather than hanging them outside where they might collect pollen.  Rinse hair and eyes before bedtime.  Control of House Dust Mite Allergen Dust mite allergens are a common trigger of allergy and asthma symptoms. While they can be found throughout the house, these microscopic  creatures thrive in warm, humid environments such as bedding, upholstered furniture and carpeting. Because so much time is spent in the bedroom, it is essential to reduce mite levels there.  Encase pillows, mattresses, and box springs in special allergen-proof fabric covers or airtight, zippered plastic covers.  Bedding should be washed weekly in hot water (130 F) and dried in a hot dryer. Allergen-proof covers are available for comforters and pillows that can't be regularly washed.  Wash the allergy-proof covers every few months. Minimize clutter in the bedroom. Keep pets out of the bedroom.  Keep humidity less than 50% by using a dehumidifier or air conditioning. You can buy a humidity measuring device called a hygrometer to monitor this.  If possible, replace carpets with hardwood, linoleum, or washable area rugs. If that's not possible, vacuum frequently with a vacuum that has a HEPA filter. Remove all upholstered furniture and non-washable window drapes from the bedroom. Remove all non-washable stuffed toys from the bedroom.  Wash stuffed toys weekly.  Mold Control Mold and fungi can grow on a variety of surfaces provided certain temperature and moisture conditions exist.  Outdoor molds grow on plants, decaying vegetation and soil. The major outdoor mold, Alternaria and Cladosporium, are found in very high numbers during hot and dry conditions. Generally, a late summer - fall peak is seen for common outdoor fungal spores. Rain will temporarily lower outdoor mold spore count, but counts rise rapidly when the rainy period ends. The most important indoor molds are Aspergillus and Penicillium. Dark, humid and poorly ventilated basements are ideal sites for mold growth. The next most common sites of mold growth are the bathroom and  the kitchen. Outdoor (Seasonal) Mold Control Use air conditioning and keep windows closed. Avoid exposure to decaying vegetation. Avoid leaf raking. Avoid grain  handling. Consider wearing a face mask if working in moldy areas.  Indoor (Perennial) Mold Control  Maintain humidity below 50%. Get rid of mold growth on hard surfaces with water, detergent and, if necessary, 5% bleach (do not mix with other cleaners). Then dry the area completely. If mold covers an area more than 10 square feet, consider hiring an indoor environmental professional. For clothing, washing with soap and water is best. If moldy items cannot be cleaned and dried, throw them away. Remove sources e.g. contaminated carpets. Repair and seal leaking roofs or pipes. Using dehumidifiers in damp basements may be helpful, but empty the water and clean units regularly to prevent mildew from forming. All rooms, especially basements, bathrooms and kitchens, require ventilation and cleaning to deter mold and mildew growth. Avoid carpeting on concrete or damp floors, and storing items in damp areas. Pet Allergen Avoidance: Contrary to popular opinion, there are no "hypoallergenic" breeds of dogs or cats. That is because people are not allergic to an animal's hair, but to an allergen found in the animal's saliva, dander (dead skin flakes) or urine. Pet allergy symptoms typically occur within minutes. For some people, symptoms can build up and become most severe 8 to 12 hours after contact with the animal. People with severe allergies can experience reactions in public places if dander has been transported on the pet owners' clothing. Keeping an animal outdoors is only a partial solution, since homes with pets in the yard still have higher concentrations of animal allergens. Before getting a pet, ask your allergist to determine if you are allergic to animals. If your pet is already considered part of your family, try to minimize contact and keep the pet out of the bedroom and other rooms where you spend a great deal of time. As with dust mites, vacuum carpets often or replace carpet with a hardwood floor,  tile or linoleum. High-efficiency particulate air (HEPA) cleaners can reduce allergen levels over time. While dander and saliva are the source of cat and dog allergens, urine is the source of allergens from rabbits, hamsters, mice and [place] pigs; so ask a non-allergic family member to clean the animal's cage. If you have a pet allergy, talk to your allergist about the potential for allergy immunotherapy (allergy shots). This strategy can often provide long-term relief.

## 2023-03-20 ENCOUNTER — Encounter: Payer: Self-pay | Admitting: Allergy

## 2023-03-20 DIAGNOSIS — J4520 Mild intermittent asthma, uncomplicated: Secondary | ICD-10-CM | POA: Insufficient documentation

## 2023-03-20 NOTE — Assessment & Plan Note (Signed)
Used albuterol in March when sick with flu but his allergies were also flaring. Today's spirometry showed some mild restriction. Monitor symptoms. May use albuterol rescue inhaler 2 puffs every 4 to 6 hours as needed for shortness of breath, chest tightness, coughing, and wheezing. Monitor frequency of use.

## 2023-03-20 NOTE — Assessment & Plan Note (Signed)
See handout for lifestyle and dietary modifications. May take famotidine as needed.

## 2023-03-20 NOTE — Assessment & Plan Note (Signed)
Worsening rhino conjunctivitis symptoms from January to April only. Tried Flonase, Xyzal with some benefit. No prior allergy testing. Today's skin testing showed: Positive to maple mix, mold, cat, dog and borderline to dust mites. Negative to common foods.  Start environmental control measures as below.  Start taking these medications in January.  Use over the counter antihistamines such as Zyrtec (cetirizine), Claritin (loratadine), Allegra (fexofenadine), or Xyzal (levocetirizine) daily as needed. May take twice a day during allergy flares. May switch antihistamines every few months. Use Flonase (fluticasone) nasal spray 1 spray per nostril twice a day as needed for nasal congestion.  Use azelastine nasal spray 1-2 sprays per nostril twice a day as needed for runny nose/drainage. Nasal saline spray (i.e., Simply Saline) or nasal saline lavage (i.e., NeilMed) is recommended as needed and prior to medicated nasal sprays.

## 2023-04-06 DIAGNOSIS — Z0000 Encounter for general adult medical examination without abnormal findings: Secondary | ICD-10-CM | POA: Diagnosis not present

## 2023-04-06 DIAGNOSIS — B356 Tinea cruris: Secondary | ICD-10-CM | POA: Diagnosis not present

## 2023-04-06 DIAGNOSIS — N529 Male erectile dysfunction, unspecified: Secondary | ICD-10-CM | POA: Diagnosis not present

## 2023-04-06 DIAGNOSIS — E7800 Pure hypercholesterolemia, unspecified: Secondary | ICD-10-CM | POA: Diagnosis not present

## 2023-04-06 DIAGNOSIS — Z125 Encounter for screening for malignant neoplasm of prostate: Secondary | ICD-10-CM | POA: Diagnosis not present

## 2023-04-06 DIAGNOSIS — F419 Anxiety disorder, unspecified: Secondary | ICD-10-CM | POA: Diagnosis not present

## 2023-11-19 DIAGNOSIS — M25512 Pain in left shoulder: Secondary | ICD-10-CM | POA: Diagnosis not present

## 2023-12-02 DIAGNOSIS — M19012 Primary osteoarthritis, left shoulder: Secondary | ICD-10-CM | POA: Insufficient documentation

## 2023-12-03 DIAGNOSIS — M19012 Primary osteoarthritis, left shoulder: Secondary | ICD-10-CM | POA: Diagnosis not present

## 2023-12-04 ENCOUNTER — Other Ambulatory Visit: Payer: Self-pay | Admitting: Allergy

## 2023-12-11 DIAGNOSIS — J069 Acute upper respiratory infection, unspecified: Secondary | ICD-10-CM | POA: Diagnosis not present

## 2023-12-11 DIAGNOSIS — Z03818 Encounter for observation for suspected exposure to other biological agents ruled out: Secondary | ICD-10-CM | POA: Diagnosis not present

## 2023-12-11 DIAGNOSIS — H1031 Unspecified acute conjunctivitis, right eye: Secondary | ICD-10-CM | POA: Diagnosis not present

## 2023-12-27 ENCOUNTER — Other Ambulatory Visit: Payer: Self-pay | Admitting: Allergy

## 2024-01-14 ENCOUNTER — Other Ambulatory Visit: Payer: Self-pay

## 2024-01-14 ENCOUNTER — Ambulatory Visit (INDEPENDENT_AMBULATORY_CARE_PROVIDER_SITE_OTHER): Payer: Medicare HMO | Admitting: Allergy

## 2024-01-14 ENCOUNTER — Encounter: Payer: Self-pay | Admitting: Allergy

## 2024-01-14 VITALS — BP 112/68 | HR 81 | Temp 98.4°F | Resp 18 | Ht 67.72 in | Wt 206.8 lb

## 2024-01-14 DIAGNOSIS — J302 Other seasonal allergic rhinitis: Secondary | ICD-10-CM | POA: Diagnosis not present

## 2024-01-14 DIAGNOSIS — H1013 Acute atopic conjunctivitis, bilateral: Secondary | ICD-10-CM | POA: Diagnosis not present

## 2024-01-14 DIAGNOSIS — K219 Gastro-esophageal reflux disease without esophagitis: Secondary | ICD-10-CM

## 2024-01-14 DIAGNOSIS — J4520 Mild intermittent asthma, uncomplicated: Secondary | ICD-10-CM

## 2024-01-14 DIAGNOSIS — J3089 Other allergic rhinitis: Secondary | ICD-10-CM

## 2024-01-14 DIAGNOSIS — H1010 Acute atopic conjunctivitis, unspecified eye: Secondary | ICD-10-CM

## 2024-01-14 MED ORDER — AZELASTINE HCL 0.1 % NA SOLN: 1.0000 | Freq: Two times a day (BID) | NASAL | 11 refills | Status: DC | PRN

## 2024-01-14 MED ORDER — ALBUTEROL SULFATE HFA 108 (90 BASE) MCG/ACT IN AERS: 2.0000 | INHALATION_SPRAY | RESPIRATORY_TRACT | 1 refills | Status: AC | PRN

## 2024-01-14 MED ORDER — CETIRIZINE HCL 10 MG PO TABS: 10.0000 mg | ORAL_TABLET | Freq: Two times a day (BID) | ORAL | 11 refills | Status: AC | PRN

## 2024-01-14 NOTE — Progress Notes (Signed)
 Follow Up Note  RE: Jacob Griffith MRN: [id number] DOB: 10/16/1956 Date of Office Visit: 01/14/2024  Referring provider: [name], PA Primary care provider: [name], [name]  Chief Complaint: Allergic Rhinitis  (No issues)  History of Present Illness: I had the pleasure of seeing Jacob Griffith for a follow up visit at the [facility] on 01/14/2024. He is a 68 y.o. male, who is being followed for allergic rhino conjunctivitis, GERD, RAD. His previous allergy office visit was on 03/19/2023 with [name]. Today is a regular follow up visit.  Discussed the use of AI scribe software for clinical note transcription with the patient, who gave verbal consent to proceed.    He experiences allergies primarily during the winter months, starting in December. He takes Zyrtec twice daily, once in the morning and once at night, as one dose was not sufficient. This regimen does not cause drowsiness. He uses azelastine nasal spray, administering two sprays per nostril in the morning and at night, which he finds effective without causing epistaxis. He previously tried [employer], but it was not effective for him.  For sinus issues, he occasionally uses Primatene Mist in the mornings to help clear phlegm from his throat, particularly when he experiences phlegm two to three times a week. He had an albuterol inhaler in the past but has not refilled it recently. No shortness of breath, wheezing, or chest tightness. He occasionally experiences postnasal drip and stuffiness.  He continues to take famotidine as needed for heartburn.  He mentions a recent issue with the [facility], where he was informed of a possible hepatitis B infection after donating blood. However, further testing confirmed he does not have hepatitis B, but he is currently unable to donate blood for six months due to the initial false positive result.  He rides his bike at least once a week around [facility], which is a  seven-mile trail.     Assessment and Plan: [name] is a 68 y.o. male with: Seasonal and perennial allergic rhinoconjunctivitis Past history - worsening rhino conjunctivitis symptoms from January to April only. Tried Flonase, Xyzal with some benefit. 2024 skin testing positive to maple mix, mold, cat, dog and borderline to dust mites. Negative to common foods.  Interim history - Symptoms improved with Zyrtec 10mg  BID and Azelastine nasal spray 2 sprays per nostril BID. No nosebleeds reported. Flonase was ineffective. Continue environmental control measures as below. Start taking these medications in December Use over the counter antihistamines such as Zyrtec (cetirizine), Claritin (loratadine), Allegra (fexofenadine), or Xyzal (levocetirizine) daily as needed. May take twice a day during allergy flares. May switch antihistamines every few months. Use azelastine nasal spray 1-2 sprays per nostril twice a day as needed for runny nose/drainage. Nasal saline spray (i.e., Simply Saline) or nasal saline lavage (i.e., NeilMed) is recommended as needed and prior to medicated nasal sprays.   Mild intermittent reactive airway disease Past history - used albuterol in March when sick with flu but his allergies were also flaring. 2024 spirometry showed some mild restriction. Interim history - Occasional morning phlegm and use of Primatene Mist 2-3 times per week with good benefit. No shortness of breath or wheezing. Able to ride for 7 miles with no issues.  Monitor symptoms. May use albuterol rescue inhaler 2 puffs every 4 to 6 hours as needed for shortness of breath, chest tightness, coughing, and wheezing.  See if this works better than the primatene mist.  Monitor frequency  of use - if you need to use it more than twice per week on a consistent basis let us know.  Get spirometry at next visit.  Gastroesophageal reflux disease Continue lifestyle and dietary modifications. May take famotidine as  needed.  Return in about 6 months (around 07/13/2024).  Meds ordered this encounter  Medications   azelastine (ASTELIN) 0.1 % nasal spray    Sig: Place 1-2 sprays into both nostrils 2 (two) times daily as needed (nasal drainage). Use in each nostril as directed    Dispense:  30 mL    Refill:  11   cetirizine (ZYRTEC) 10 MG tablet    Sig: Take 1 tablet (10 mg total) by mouth 2 (two) times daily as needed for allergies.    Dispense:  60 tablet    Refill:  11   albuterol (VENTOLIN HFA) 108 (90 Base) MCG/ACT inhaler    Sig: Inhale 2 puffs into the lungs every 4 (four) hours as needed for wheezing or shortness of breath (coughing fits).    Dispense:  18 g    Refill:  1   Lab Orders  No laboratory test(s) ordered today    Diagnostics: None.   Medication List:  Current Outpatient Medications  Medication Sig Dispense Refill   albuterol (VENTOLIN HFA) 108 (90 Base) MCG/ACT inhaler Inhale 2 puffs into the lungs every 4 (four) hours as needed for wheezing or shortness of breath (coughing fits). 18 g 1   ALPRAZolam (XANAX) 0.5 MG tablet Take 0.5 mg by mouth at bedtime as needed for sleep.     atorvastatin (LIPITOR) 20 MG tablet Take 20 mg by mouth daily.     cetirizine (ZYRTEC) 10 MG tablet Take 1 tablet (10 mg total) by mouth 2 (two) times daily as needed for allergies. 60 tablet 11   Garlic 10 MG CAPS garlic     Multiple Vitamins-Minerals (CENTRUM SILVER) tablet as directed Orally     Vitamin D, Cholecalciferol, 10 MCG (400 UNIT) CAPS 1 capsule     azelastine (ASTELIN) 0.1 % nasal spray Place 1-2 sprays into both nostrils 2 (two) times daily as needed (nasal drainage). Use in each nostril as directed 30 mL 11   Current Facility-Administered Medications  Medication Dose Route Frequency Provider Last Rate Last Admin   0.9 %  sodium chloride infusion  500 mL Intravenous Once Cirigliano, Vito V, DO       Allergies: Allergies  Allergen Reactions   Amoxicillin Hives and Other (See  Comments)    Every joint in body hurt  Has patient had a PCN reaction causing immediate rash, facial/tongue/throat swelling, SOB or lightheadedness with hypotension: no  Has patient had a PCN reaction causing severe rash involving mucus membranes or skin necrosis: no  Has patient had a PCN reaction that required hospitalization no  Has patient had a PCN reaction occurring within the last 10 years: no  If all of the above answers are "NO", then may proceed w   Aspirin     Other Reaction(s): stomach irritation   Dilaudid [Hydromorphone] Nausea And Vomiting    Tolerates oxycodone, morphine, tramadol   I reviewed his past medical history, social history, family history, and environmental history and no significant changes have been reported from his previous visit.  Review of Systems  Constitutional:  Negative for appetite change, chills, fever and unexpected weight change.  HENT:  Positive for postnasal drip. Negative for rhinorrhea.   Eyes:  Negative for itching.  Respiratory:  Positive for  cough. Negative for chest tightness, shortness of breath and wheezing.   Cardiovascular:  Negative for chest pain.  Gastrointestinal:  Negative for abdominal pain.  Genitourinary:  Negative for difficulty urinating.  Skin:  Negative for rash.  Allergic/Immunologic: Positive for environmental allergies. Negative for food allergies.  Neurological:  Negative for headaches.    Objective: BP 112/68 (BP Location: Right Arm, Patient Position: Sitting)   Pulse 81   Temp 98.4 F (36.9 C) (Temporal)   Resp 18   Ht 5' 7.72" (1.72 m)   Wt 206 lb 12.8 oz (93.8 kg)   SpO2 96%   BMI 31.71 kg/m  Body mass index is 31.71 kg/m. Physical Exam Vitals and nursing note reviewed.  Constitutional:      Appearance: Normal appearance. He is well-developed.  HENT:     Head: Normocephalic and atraumatic.     Right Ear: Tympanic membrane and external ear normal.     Left Ear: Tympanic membrane and external  ear normal.     Nose: Nose normal.     Mouth/Throat:     Mouth: Mucous membranes are moist.     Pharynx: Oropharynx is clear.  Eyes:     Conjunctiva/sclera: Conjunctivae normal.  Cardiovascular:     Rate and Rhythm: Normal rate and regular rhythm.     Heart sounds: Normal heart sounds. No murmur heard.    No friction rub. No gallop.  Pulmonary:     Effort: Pulmonary effort is normal.     Breath sounds: Normal breath sounds. No wheezing, rhonchi or rales.  Musculoskeletal:     Cervical back: Neck supple.  Skin:    General: Skin is warm.     Findings: No rash.  Neurological:     Mental Status: He is alert and oriented to person, place, and time.  Psychiatric:        Behavior: Behavior normal.    Previous notes and tests were reviewed. The plan was reviewed with the patient/family, and all questions/concerned were addressed.  It was my pleasure to see Kable today and participate in his care. Please feel free to contact me with any questions or concerns.  Sincerely,  Wyline Mood, DO Allergy & Immunology  [facility] office: [phone] [facility] office: [phone]

## 2024-01-14 NOTE — Patient Instructions (Addendum)
 Environmental allergies 2024 skin testing positive to maple mix, mold, cat, dog and borderline to dust mites. Continue environmental control measures as below.  Start taking these medications in December Use over the counter antihistamines such as Zyrtec (cetirizine), Claritin (loratadine), Allegra (fexofenadine), or Xyzal (levocetirizine) daily as needed. May take twice a day during allergy flares. May switch antihistamines every few months. Use azelastine nasal spray 1-2 sprays per nostril twice a day as needed for runny nose/drainage. Nasal saline spray (i.e., Simply Saline) or nasal saline lavage (i.e., NeilMed) is recommended as needed and prior to medicated nasal sprays.  Breathing May use albuterol rescue inhaler 2 puffs every 4 to 6 hours as needed for shortness of breath, chest tightness, coughing, and wheezing.  See if this works better than the primatene mist.  Monitor frequency of use - if you need to use it more than twice per week on a consistent basis let us know.   Heartburn Continue lifestyle and dietary modifications. May take famotidine as needed.  Return in about 6 months (around 07/13/2024). Or sooner if needed.   Reducing Pollen Exposure Pollen seasons: trees (spring), grass (summer) and ragweed/weeds (fall). Keep windows closed in your home and car to lower pollen exposure.  Install air conditioning in the bedroom and throughout the house if possible.  Avoid going out in dry windy days - especially early morning. Pollen counts are highest between 5 - 10 AM and on dry, hot and windy days.  Save outside activities for late afternoon or after a heavy rain, when pollen levels are lower.  Avoid mowing of grass if you have grass pollen allergy. Be aware that pollen can also be transported indoors on people and pets.  Dry your clothes in an automatic dryer rather than hanging them outside where they might collect pollen.  Rinse hair and eyes before bedtime.  Control of  House Dust Mite Allergen Dust mite allergens are a common trigger of allergy and asthma symptoms. While they can be found throughout the house, these microscopic creatures thrive in warm, humid environments such as bedding, upholstered furniture and carpeting. Because so much time is spent in the bedroom, it is essential to reduce mite levels there.  Encase pillows, mattresses, and box springs in special allergen-proof fabric covers or airtight, zippered plastic covers.  Bedding should be washed weekly in hot water (130 F) and dried in a hot dryer. Allergen-proof covers are available for comforters and pillows that can't be regularly washed.  Wash the allergy-proof covers every few months. Minimize clutter in the bedroom. Keep pets out of the bedroom.  Keep humidity less than 50% by using a dehumidifier or air conditioning. You can buy a humidity measuring device called a hygrometer to monitor this.  If possible, replace carpets with hardwood, linoleum, or washable area rugs. If that's not possible, vacuum frequently with a vacuum that has a HEPA filter. Remove all upholstered furniture and non-washable window drapes from the bedroom. Remove all non-washable stuffed toys from the bedroom.  Wash stuffed toys weekly.  Mold Control Mold and fungi can grow on a variety of surfaces provided certain temperature and moisture conditions exist.  Outdoor molds grow on plants, decaying vegetation and soil. The major outdoor mold, Alternaria and Cladosporium, are found in very high numbers during hot and dry conditions. Generally, a late summer - fall peak is seen for common outdoor fungal spores. Rain will temporarily lower outdoor mold spore count, but counts rise rapidly when the rainy period ends. The  most important indoor molds are Aspergillus and Penicillium. Dark, humid and poorly ventilated basements are ideal sites for mold growth. The next most common sites of mold growth are the bathroom and the  kitchen. Outdoor (Seasonal) Mold Control Use air conditioning and keep windows closed. Avoid exposure to decaying vegetation. Avoid leaf raking. Avoid grain handling. Consider wearing a face mask if working in moldy areas.  Indoor (Perennial) Mold Control  Maintain humidity below 50%. Get rid of mold growth on hard surfaces with water, detergent and, if necessary, 5% bleach (do not mix with other cleaners). Then dry the area completely. If mold covers an area more than 10 square feet, consider hiring an indoor environmental professional. For clothing, washing with soap and water is best. If moldy items cannot be cleaned and dried, throw them away. Remove sources e.g. contaminated carpets. Repair and seal leaking roofs or pipes. Using dehumidifiers in damp basements may be helpful, but empty the water and clean units regularly to prevent mildew from forming. All rooms, especially basements, bathrooms and kitchens, require ventilation and cleaning to deter mold and mildew growth. Avoid carpeting on concrete or damp floors, and storing items in damp areas. Pet Allergen Avoidance: Contrary to popular opinion, there are no "hypoallergenic" breeds of dogs or cats. That is because people are not allergic to an animal's hair, but to an allergen found in the animal's saliva, dander (dead skin flakes) or urine. Pet allergy symptoms typically occur within minutes. For some people, symptoms can build up and become most severe 8 to 12 hours after contact with the animal. People with severe allergies can experience reactions in public places if dander has been transported on the pet owners' clothing. Keeping an animal outdoors is only a partial solution, since homes with pets in the yard still have higher concentrations of animal allergens. Before getting a pet, ask your allergist to determine if you are allergic to animals. If your pet is already considered part of your family, try to minimize contact and keep  the pet out of the bedroom and other rooms where you spend a great deal of time. As with dust mites, vacuum carpets often or replace carpet with a hardwood floor, tile or linoleum. High-efficiency particulate air (HEPA) cleaners can reduce allergen levels over time. While dander and saliva are the source of cat and dog allergens, urine is the source of allergens from rabbits, hamsters, mice and [place] pigs; so ask a non-allergic family member to clean the animal's cage. If you have a pet allergy, talk to your allergist about the potential for allergy immunotherapy (allergy shots). This strategy can often provide long-term relief.

## 2024-02-24 ENCOUNTER — Other Ambulatory Visit: Payer: Self-pay | Admitting: Allergy

## 2024-05-05 DIAGNOSIS — G4700 Insomnia, unspecified: Secondary | ICD-10-CM | POA: Diagnosis not present

## 2024-05-05 DIAGNOSIS — Z0000 Encounter for general adult medical examination without abnormal findings: Secondary | ICD-10-CM | POA: Diagnosis not present

## 2024-05-05 DIAGNOSIS — Z125 Encounter for screening for malignant neoplasm of prostate: Secondary | ICD-10-CM | POA: Diagnosis not present

## 2024-05-05 DIAGNOSIS — Z6830 Body mass index (BMI) 30.0-30.9, adult: Secondary | ICD-10-CM | POA: Diagnosis not present

## 2024-05-05 DIAGNOSIS — E7800 Pure hypercholesterolemia, unspecified: Secondary | ICD-10-CM | POA: Diagnosis not present

## 2024-05-05 DIAGNOSIS — Z1331 Encounter for screening for depression: Secondary | ICD-10-CM | POA: Diagnosis not present

## 2024-06-30 DIAGNOSIS — M19012 Primary osteoarthritis, left shoulder: Secondary | ICD-10-CM | POA: Diagnosis not present

## 2024-07-13 NOTE — Progress Notes (Unsigned)
 Follow Up Note  RE: Jacob Griffith MRN: [id number] DOB: September 23, 1956 Date of Office Visit: 07/14/2024  Referring provider: Alys Schuyler HERO, PA Primary care provider: Alys Schuyler HERO, [place]  Chief Complaint: No chief complaint on file.  History of Present Illness: I had the pleasure of seeing Jacob Griffith for a follow up visit at the [facility] on 07/14/2024. He is a 68 y.o. male, who is being followed for allergic rhinoconjunctivitis, reactive airway disease and GERD. His previous allergy  office visit was on 01/14/2024 with Dr. Luke. Today is a regular follow up visit.  Discussed the use of AI scribe software for clinical note transcription with the patient, who gave verbal consent to proceed.  History of Present Illness            ***  Assessment and Plan: Jacob Griffith is a 68 y.o. male with: Seasonal and perennial allergic rhinoconjunctivitis Past history - worsening rhino conjunctivitis symptoms from January to April only. Tried Flonase , Xyzal with some benefit. 2024 skin testing positive to maple mix, mold, cat, dog and borderline to dust mites. Negative to common foods.  Interim history - Symptoms improved with Zyrtec  10mg  BID and Azelastine  nasal spray 2 sprays per nostril BID. No nosebleeds reported. Flonase  was ineffective. Continue environmental control measures as below. Start taking these medications in December Use over the counter antihistamines such as Zyrtec  (cetirizine ), Claritin (loratadine), Allegra (fexofenadine), or Xyzal (levocetirizine) daily as needed. May take twice a day during allergy  flares. May switch antihistamines every few months. Use azelastine  nasal spray 1-2 sprays per nostril twice a day as needed for runny nose/drainage. Nasal saline spray (i.e., Simply Saline) or nasal saline lavage (i.e., NeilMed) is recommended as needed and prior to medicated nasal sprays.   Mild intermittent reactive airway disease Past history - used  albuterol  in March when sick with flu but his allergies were also flaring. 2024 spirometry showed some mild restriction. Interim history - Occasional morning phlegm and use of Primatene Mist 2-3 times per week with good benefit. No shortness of breath or wheezing. Able to ride for 7 miles with no issues.  Monitor symptoms. May use albuterol  rescue inhaler 2 puffs every 4 to 6 hours as needed for shortness of breath, chest tightness, coughing, and wheezing.  See if this works better than the primatene mist.  Monitor frequency of use - if you need to use it more than twice per week on a consistent basis let us  know.  Get spirometry at next visit.   Gastroesophageal reflux disease Continue lifestyle and dietary modifications. May take famotidine as needed. Assessment and Plan              No follow-ups on file.  No orders of the defined types were placed in this encounter.  Lab Orders  No laboratory test(s) ordered today    Diagnostics: Spirometry:  Tracings reviewed. His effort: {Blank single:19197::Good reproducible efforts.,It was hard to get consistent efforts and there is a question as to whether this reflects a maximal maneuver.,Poor effort, data can not be interpreted.} FVC: ***L FEV1: ***L, ***% predicted FEV1/FVC ratio: ***% Interpretation: {Blank single:19197::Spirometry consistent with mild obstructive disease,Spirometry consistent with moderate obstructive disease,Spirometry consistent with severe obstructive disease,Spirometry consistent with possible restrictive disease,Spirometry consistent with mixed obstructive and restrictive disease,Spirometry uninterpretable due to technique,Spirometry consistent with normal pattern,No overt abnormalities noted given today's efforts}.  Please see scanned spirometry results for details.  Skin Testing: {Blank single:19197::Select foods,Environmental allergy  panel,Environmental allergy  panel and select  foods,Food allergy  panel,None,Deferred due to recent antihistamines use}. *** Results discussed with patient/family.   Medication List:  Current Outpatient Medications  Medication Sig Dispense Refill  . albuterol  (VENTOLIN  HFA) 108 (90 Base) MCG/ACT inhaler Inhale 2 puffs into the lungs every 4 (four) hours as needed for wheezing or shortness of breath (coughing fits). 18 g 1  . ALPRAZolam  (XANAX ) 0.5 MG tablet Take 0.5 mg by mouth at bedtime as needed for sleep.    . atorvastatin  (LIPITOR) 20 MG tablet Take 20 mg by mouth daily.    . azelastine  (ASTELIN ) 0.1 % nasal spray Place 1-2 sprays into both nostrils 2 (two) times daily as needed (nasal drainage). Use in each nostril as directed 30 mL 11  . cetirizine  (ZYRTEC ) 10 MG tablet Take 1 tablet (10 mg total) by mouth 2 (two) times daily as needed for allergies. 60 tablet 11  . fluticasone  (FLONASE ) 50 MCG/ACT nasal spray PLACE 1 SPRAY INTO BOTH NOSTRILS 2 (TWO) TIMES DAILY AS NEEDED (NASAL CONGESTION). 48 mL 1  . Garlic 10 MG CAPS garlic    . Multiple Vitamins-Minerals (CENTRUM SILVER) tablet as directed Orally    . Vitamin D, Cholecalciferol, 10 MCG (400 UNIT) CAPS 1 capsule     Current Facility-Administered Medications  Medication Dose Route Frequency Provider Last Rate Last Admin  . 0.9 %  sodium chloride  infusion  500 mL Intravenous Once Cirigliano, Vito V, DO       Allergies: Allergies  Allergen Reactions  . Amoxicillin Hives and Other (See Comments)    Every joint in body hurt  Has patient had a PCN reaction causing immediate rash, facial/tongue/throat swelling, SOB or lightheadedness with hypotension: no  Has patient had a PCN reaction causing severe rash involving mucus membranes or skin necrosis: no  Has patient had a PCN reaction that required hospitalization no  Has patient had a PCN reaction occurring within the last 10 years: no  If all of the above answers are NO, then may proceed w  . Aspirin     Other  Reaction(s): stomach irritation  . Dilaudid  [Hydromorphone ] Nausea And Vomiting    Tolerates oxycodone , morphine , tramadol    I reviewed his past medical history, social history, family history, and environmental history and no significant changes have been reported from his previous visit.  Review of Systems  Constitutional:  Negative for appetite change, chills, fever and unexpected weight change.  HENT:  Positive for postnasal drip. Negative for rhinorrhea.   Eyes:  Negative for itching.  Respiratory:  Positive for cough. Negative for chest tightness, shortness of breath and wheezing.   Cardiovascular:  Negative for chest pain.  Gastrointestinal:  Negative for abdominal pain.  Genitourinary:  Negative for difficulty urinating.  Skin:  Negative for rash.  Allergic/Immunologic: Positive for environmental allergies. Negative for food allergies.  Neurological:  Negative for headaches.    Objective: There were no vitals taken for this visit. There is no height or weight on file to calculate BMI. Physical Exam Vitals and nursing note reviewed.  Constitutional:      Appearance: Normal appearance. He is well-developed.  HENT:     Head: Normocephalic and atraumatic.     Right Ear: Tympanic membrane and external ear normal.     Left Ear: Tympanic membrane and external ear normal.     Nose: Nose normal.     Mouth/Throat:     Mouth: Mucous membranes are moist.     Pharynx: Oropharynx is clear.  Eyes:  Conjunctiva/sclera: Conjunctivae normal.  Cardiovascular:     Rate and Rhythm: Normal rate and regular rhythm.     Heart sounds: Normal heart sounds. No murmur heard.    No friction rub. No gallop.  Pulmonary:     Effort: Pulmonary effort is normal.     Breath sounds: Normal breath sounds. No wheezing, rhonchi or rales.  Musculoskeletal:     Cervical back: Neck supple.  Skin:    General: Skin is warm.     Findings: No rash.  Neurological:     Mental Status: He is alert and  oriented to person, place, and time.  Psychiatric:        Behavior: Behavior normal.   Previous notes and tests were reviewed. The plan was reviewed with the patient/family, and all questions/concerned were addressed.  It was my pleasure to see Jacob Griffith today and participate in his care. Please feel free to contact me with any questions or concerns.  Sincerely,  Orlan Cramp, DO Allergy  & Immunology  [facility] [place]  [place] office: [phone] [facility] office: [phone]

## 2024-07-14 ENCOUNTER — Encounter: Payer: Self-pay | Admitting: Allergy

## 2024-07-14 ENCOUNTER — Ambulatory Visit: Payer: Medicare HMO | Admitting: Allergy

## 2024-07-14 VITALS — BP 128/72 | HR 67 | Temp 98.7°F | Resp 18 | Wt 198.8 lb

## 2024-07-14 DIAGNOSIS — K219 Gastro-esophageal reflux disease without esophagitis: Secondary | ICD-10-CM

## 2024-07-14 DIAGNOSIS — J4520 Mild intermittent asthma, uncomplicated: Secondary | ICD-10-CM | POA: Diagnosis not present

## 2024-07-14 DIAGNOSIS — J3089 Other allergic rhinitis: Secondary | ICD-10-CM

## 2024-07-14 DIAGNOSIS — J302 Other seasonal allergic rhinitis: Secondary | ICD-10-CM

## 2024-07-14 DIAGNOSIS — H1010 Acute atopic conjunctivitis, unspecified eye: Secondary | ICD-10-CM

## 2024-07-14 DIAGNOSIS — H1013 Acute atopic conjunctivitis, bilateral: Secondary | ICD-10-CM | POA: Diagnosis not present

## 2024-07-14 MED ORDER — FLUTICASONE PROPIONATE 50 MCG/ACT NA SUSP: 1.0000 | Freq: Every day | NASAL | 5 refills | Status: AC | PRN

## 2024-07-14 NOTE — Patient Instructions (Addendum)
 Environmental allergies 2024 skin testing positive to maple mix, mold, cat, dog and borderline to dust mites. Continue environmental control measures as below.  Start taking these medications in December Use over the counter antihistamines such as Zyrtec  (cetirizine ), Claritin (loratadine), Allegra (fexofenadine), or Xyzal (levocetirizine) daily as needed. May take twice a day during allergy  flares. May switch antihistamines every few months. Use Flonase  (fluticasone ) nasal spray 1-2 sprays per nostril once a day as needed for nasal congestion.  Nasal saline spray (i.e., Simply Saline) or nasal saline lavage (i.e., NeilMed) is recommended as needed and prior to medicated nasal sprays. If symptoms not controlled then let me know, I can prescribe montelukast next.   Breathing May use albuterol  rescue inhaler 2 puffs every 4 to 6 hours as needed for shortness of breath, chest tightness, coughing, and wheezing.  Monitor frequency of use - if you need to use it more than twice per week on a consistent basis let us  know.   Heartburn Continue lifestyle and dietary modifications. May take famotidine as needed.  Return in about 6 months (around 01/14/2025). Or sooner if needed.   Reducing Pollen Exposure Pollen seasons: trees (spring), grass (summer) and ragweed/weeds (fall). Keep windows closed in your home and car to lower pollen exposure.  Install air conditioning in the bedroom and throughout the house if possible.  Avoid going out in dry windy days - especially early morning. Pollen counts are highest between 5 - 10 AM and on dry, hot and windy days.  Save outside activities for late afternoon or after a heavy rain, when pollen levels are lower.  Avoid mowing of grass if you have grass pollen allergy . Be aware that pollen can also be transported indoors on people and pets.  Dry your clothes in an automatic dryer rather than hanging them outside where they might collect pollen.  Rinse hair  and eyes before bedtime.  Control of House Dust Mite Allergen Dust mite allergens are a common trigger of allergy  and asthma symptoms. While they can be found throughout the house, these microscopic creatures thrive in warm, humid environments such as bedding, upholstered furniture and carpeting. Because so much time is spent in the bedroom, it is essential to reduce mite levels there.  Encase pillows, mattresses, and box springs in special allergen-proof fabric covers or airtight, zippered plastic covers.  Bedding should be washed weekly in hot water (130 F) and dried in a hot dryer. Allergen-proof covers are available for comforters and pillows that can't be regularly washed.  Wash the allergy -proof covers every few months. Minimize clutter in the bedroom. Keep pets out of the bedroom.  Keep humidity less than 50% by using a dehumidifier or air conditioning. You can buy a humidity measuring device called a hygrometer to monitor this.  If possible, replace carpets with hardwood, linoleum, or washable area rugs. If that's not possible, vacuum frequently with a vacuum that has a HEPA filter. Remove all upholstered furniture and non-washable window drapes from the bedroom. Remove all non-washable stuffed toys from the bedroom.  Wash stuffed toys weekly.  Mold Control Mold and fungi can grow on a variety of surfaces provided certain temperature and moisture conditions exist.  Outdoor molds grow on plants, decaying vegetation and soil. The major outdoor mold, Alternaria and Cladosporium, are found in very high numbers during hot and dry conditions. Generally, a late summer - fall peak is seen for common outdoor fungal spores. Rain will temporarily lower outdoor mold spore count, but counts rise rapidly  when the rainy period ends. The most important indoor molds are Aspergillus and Penicillium. Dark, humid and poorly ventilated basements are ideal sites for mold growth. The next most common sites of mold  growth are the bathroom and the kitchen. Outdoor (Seasonal) Mold Control Use air conditioning and keep windows closed. Avoid exposure to decaying vegetation. Avoid leaf raking. Avoid grain handling. Consider wearing a face mask if working in moldy areas.  Indoor (Perennial) Mold Control  Maintain humidity below 50%. Get rid of mold growth on hard surfaces with water, detergent and, if necessary, 5% bleach (do not mix with other cleaners). Then dry the area completely. If mold covers an area more than 10 square feet, consider hiring an indoor environmental professional. For clothing, washing with soap and water is best. If moldy items cannot be cleaned and dried, throw them away. Remove sources e.g. contaminated carpets. Repair and seal leaking roofs or pipes. Using dehumidifiers in damp basements may be helpful, but empty the water and clean units regularly to prevent mildew from forming. All rooms, especially basements, bathrooms and kitchens, require ventilation and cleaning to deter mold and mildew growth. Avoid carpeting on concrete or damp floors, and storing items in damp areas. Pet Allergen Avoidance: Contrary to popular opinion, there are no "hypoallergenic" breeds of dogs or cats. That is because people are not allergic to an animal's hair, but to an allergen found in the animal's saliva, dander (dead skin flakes) or urine. Pet allergy  symptoms typically occur within minutes. For some people, symptoms can build up and become most severe 8 to 12 hours after contact with the animal. People with severe allergies can experience reactions in public places if dander has been transported on the pet owners' clothing. Keeping an animal outdoors is only a partial solution, since homes with pets in the yard still have higher concentrations of animal allergens. Before getting a pet, ask your allergist to determine if you are allergic to animals. If your pet is already considered part of your family, try  to minimize contact and keep the pet out of the bedroom and other rooms where you spend a great deal of time. As with dust mites, vacuum carpets often or replace carpet with a hardwood floor, tile or linoleum. High-efficiency particulate air (HEPA) cleaners can reduce allergen levels over time. While dander and saliva are the source of cat and dog allergens, urine is the source of allergens from rabbits, hamsters, mice and [place]rael pigs; so ask a non-allergic family member to clean the animal's cage. If you have a pet allergy , talk to your allergist about the potential for allergy  immunotherapy (allergy  shots). This strategy can often provide long-term relief.

## 2024-07-29 DIAGNOSIS — M79671 Pain in right foot: Secondary | ICD-10-CM | POA: Diagnosis not present

## 2024-07-29 DIAGNOSIS — M2021 Hallux rigidus, right foot: Secondary | ICD-10-CM | POA: Diagnosis not present

## 2024-08-01 DIAGNOSIS — M2550 Pain in unspecified joint: Secondary | ICD-10-CM | POA: Diagnosis not present

## 2024-08-01 DIAGNOSIS — M5030 Other cervical disc degeneration, unspecified cervical region: Secondary | ICD-10-CM | POA: Diagnosis not present

## 2024-08-01 DIAGNOSIS — M1990 Unspecified osteoarthritis, unspecified site: Secondary | ICD-10-CM | POA: Diagnosis not present

## 2024-08-01 DIAGNOSIS — M19049 Primary osteoarthritis, unspecified hand: Secondary | ICD-10-CM | POA: Diagnosis not present

## 2024-08-01 DIAGNOSIS — Z23 Encounter for immunization: Secondary | ICD-10-CM | POA: Diagnosis not present

## 2024-09-28 DIAGNOSIS — H748X1 Other specified disorders of right middle ear and mastoid: Secondary | ICD-10-CM | POA: Diagnosis not present

## 2024-09-28 DIAGNOSIS — H9313 Tinnitus, bilateral: Secondary | ICD-10-CM | POA: Diagnosis not present

## 2024-09-28 DIAGNOSIS — H903 Sensorineural hearing loss, bilateral: Secondary | ICD-10-CM | POA: Diagnosis not present

## 2024-10-07 DIAGNOSIS — M722 Plantar fascial fibromatosis: Secondary | ICD-10-CM | POA: Diagnosis not present

## 2024-10-07 DIAGNOSIS — M2021 Hallux rigidus, right foot: Secondary | ICD-10-CM | POA: Diagnosis not present

## 2024-10-21 DIAGNOSIS — M722 Plantar fascial fibromatosis: Secondary | ICD-10-CM | POA: Diagnosis not present

## 2024-10-21 DIAGNOSIS — M79672 Pain in left foot: Secondary | ICD-10-CM | POA: Diagnosis not present

## 2025-01-10 ENCOUNTER — Ambulatory Visit: Admitting: Allergy
